# Patient Record
Sex: Female | Born: 1955 | Race: White | Hispanic: No | Smoking: Current every day smoker
Health system: Southern US, Community
[De-identification: ages and names within clinical notes are randomized; demographics above are authoritative.]

## PROBLEM LIST (undated history)

## (undated) DIAGNOSIS — E876 Hypokalemia: Secondary | ICD-10-CM

## (undated) DIAGNOSIS — M1990 Unspecified osteoarthritis, unspecified site: Secondary | ICD-10-CM

## (undated) DIAGNOSIS — M79606 Pain in leg, unspecified: Secondary | ICD-10-CM

## (undated) DIAGNOSIS — F419 Anxiety disorder, unspecified: Secondary | ICD-10-CM

## (undated) DIAGNOSIS — G8929 Other chronic pain: Secondary | ICD-10-CM

## (undated) HISTORY — PX: CARDIAC SURGERY: SHX584

---

## 2012-09-22 ENCOUNTER — Inpatient Hospital Stay (HOSPITAL_COMMUNITY)
Admission: EM | Admit: 2012-09-22 | Discharge: 2012-09-24 | DRG: 690 | Disposition: A | Discharge status: Home Health | Payer: MEDICAID | Attending: Internal Medicine | Admitting: Internal Medicine

## 2012-09-22 ENCOUNTER — Encounter (HOSPITAL_COMMUNITY): Payer: Self-pay

## 2012-09-22 DIAGNOSIS — L03119 Cellulitis of unspecified part of limb: Secondary | ICD-10-CM | POA: Diagnosis present

## 2012-09-22 DIAGNOSIS — L02419 Cutaneous abscess of limb, unspecified: Secondary | ICD-10-CM | POA: Diagnosis present

## 2012-09-22 DIAGNOSIS — L0390 Cellulitis, unspecified: Secondary | ICD-10-CM

## 2012-09-22 DIAGNOSIS — E46 Unspecified protein-calorie malnutrition: Secondary | ICD-10-CM

## 2012-09-22 DIAGNOSIS — N390 Urinary tract infection, site not specified: Principal | ICD-10-CM | POA: Diagnosis present

## 2012-09-22 DIAGNOSIS — F17200 Nicotine dependence, unspecified, uncomplicated: Secondary | ICD-10-CM | POA: Diagnosis present

## 2012-09-22 DIAGNOSIS — D539 Nutritional anemia, unspecified: Secondary | ICD-10-CM | POA: Diagnosis present

## 2012-09-22 DIAGNOSIS — F1010 Alcohol abuse, uncomplicated: Secondary | ICD-10-CM | POA: Diagnosis present

## 2012-09-22 DIAGNOSIS — E860 Dehydration: Secondary | ICD-10-CM

## 2012-09-22 HISTORY — DX: Hypokalemia: E87.6

## 2012-09-22 HISTORY — DX: Other chronic pain: G89.29

## 2012-09-22 HISTORY — DX: Pain in leg, unspecified: M79.606

## 2012-09-22 HISTORY — DX: Anxiety disorder, unspecified: F41.9

## 2012-09-22 LAB — COMPREHENSIVE METABOLIC PANEL
ALT: 18 U/L (ref 0–35)
AST: 46 U/L — ABNORMAL HIGH (ref 0–37)
Albumin: 1.7 g/dL — ABNORMAL LOW (ref 3.5–5.2)
Alkaline Phosphatase: 157 U/L — ABNORMAL HIGH (ref 39–117)
Potassium: 4.1 mEq/L (ref 3.5–5.1)
Sodium: 139 mEq/L (ref 135–145)
Total Protein: 5.6 g/dL — ABNORMAL LOW (ref 6.0–8.3)

## 2012-09-22 LAB — URINALYSIS, ROUTINE W REFLEX MICROSCOPIC
Nitrite: POSITIVE — AB
Specific Gravity, Urine: 1.026 (ref 1.005–1.030)
Urobilinogen, UA: 1 mg/dL (ref 0.0–1.0)

## 2012-09-22 LAB — CBC
HCT: 31.1 % — ABNORMAL LOW (ref 36.0–46.0)
MCHC: 33.8 g/dL (ref 30.0–36.0)
MCV: 106.1 fL — ABNORMAL HIGH (ref 78.0–100.0)
RDW: 14.6 % (ref 11.5–15.5)
WBC: 11.3 10*3/uL — ABNORMAL HIGH (ref 4.0–10.5)

## 2012-09-22 LAB — URINE MICROSCOPIC-ADD ON

## 2012-09-22 LAB — RAPID URINE DRUG SCREEN, HOSP PERFORMED
Amphetamines: NOT DETECTED
Opiates: POSITIVE — AB
Tetrahydrocannabinol: NOT DETECTED

## 2012-09-22 MED ORDER — SODIUM CHLORIDE 0.9 % IV BOLUS (SEPSIS): 1000.0000 mL | Freq: Once | INTRAVENOUS | Status: DC

## 2012-09-22 MED ORDER — PREGABALIN 75 MG PO CAPS
75.0000 mg | ORAL_CAPSULE | Freq: Two times a day (BID) | ORAL | Status: DC
  Administered 2012-09-23 – 2012-09-24 (×4): 75 mg via ORAL
  Filled 2012-09-22 (×3): qty 1

## 2012-09-22 MED ORDER — THIAMINE HCL 100 MG/ML IJ SOLN
100.0000 mg | Freq: Every day | INTRAMUSCULAR | Status: DC
  Filled 2012-09-22 (×2): qty 1

## 2012-09-22 MED ORDER — DIPHENHYDRAMINE HCL 50 MG/ML IJ SOLN: 50.0000 mg | Freq: Once | INTRAMUSCULAR | Status: DC

## 2012-09-22 MED ORDER — ADULT MULTIVITAMIN W/MINERALS CH
1.0000 | ORAL_TABLET | Freq: Every day | ORAL | Status: DC
  Administered 2012-09-23 – 2012-09-24 (×2): 1 via ORAL
  Filled 2012-09-22 (×2): qty 1

## 2012-09-22 MED ORDER — VITAMIN B-1 100 MG PO TABS
100.0000 mg | ORAL_TABLET | Freq: Every day | ORAL | Status: DC
  Administered 2012-09-23 – 2012-09-24 (×2): 100 mg via ORAL
  Filled 2012-09-22 (×2): qty 1

## 2012-09-22 MED ORDER — DEXTROSE 5 % IV SOLN
1.0000 g | Freq: Once | INTRAVENOUS | Status: AC
  Administered 2012-09-22: 1 g via INTRAVENOUS
  Filled 2012-09-22: qty 10

## 2012-09-22 MED ORDER — DEXTROSE 5 % IV SOLN
1.0000 g | INTRAVENOUS | Status: DC
  Administered 2012-09-23: 1 g via INTRAVENOUS
  Filled 2012-09-22 (×2): qty 10

## 2012-09-22 MED ORDER — LORAZEPAM 1 MG PO TABS
1.0000 mg | ORAL_TABLET | Freq: Every day | ORAL | Status: DC | PRN
  Administered 2012-09-23: 1 mg via ORAL

## 2012-09-22 MED ORDER — HEPARIN SODIUM (PORCINE) 5000 UNIT/ML IJ SOLN
5000.0000 [IU] | Freq: Three times a day (TID) | INTRAMUSCULAR | Status: DC
  Administered 2012-09-23 – 2012-09-24 (×5): 5000 [IU] via SUBCUTANEOUS
  Filled 2012-09-22 (×8): qty 1

## 2012-09-22 MED ORDER — SODIUM CHLORIDE 0.9 % IV SOLN
INTRAVENOUS | Status: DC
  Administered 2012-09-23: 03:00:00 via INTRAVENOUS
  Administered 2012-09-23: 100 mL/h via INTRAVENOUS
  Administered 2012-09-24: 10:00:00 via INTRAVENOUS

## 2012-09-22 MED ORDER — HYDROCODONE-ACETAMINOPHEN 5-325 MG PO TABS
1.0000 | ORAL_TABLET | Freq: Four times a day (QID) | ORAL | Status: DC | PRN
  Administered 2012-09-23: 1 via ORAL
  Filled 2012-09-22: qty 1

## 2012-09-22 MED ORDER — THIAMINE HCL 100 MG/ML IJ SOLN
Freq: Once | INTRAVENOUS | Status: AC
  Administered 2012-09-22: 20:00:00 via INTRAVENOUS
  Filled 2012-09-22: qty 1000

## 2012-09-22 MED ORDER — METHYLPREDNISOLONE SODIUM SUCC 125 MG IJ SOLR: 125.0000 mg | Freq: Once | INTRAMUSCULAR | Status: DC

## 2012-09-22 MED ORDER — FOLIC ACID 1 MG PO TABS
1.0000 mg | ORAL_TABLET | Freq: Every day | ORAL | Status: DC
  Administered 2012-09-23 – 2012-09-24 (×2): 1 mg via ORAL
  Filled 2012-09-22 (×2): qty 1

## 2012-09-22 NOTE — ED Provider Notes (Signed)
History     CSN: [id number]  Arrival date & time 09/22/12  1645   First MD Initiated Contact with Patient 09/22/12 1748      Chief Complaint  Patient presents with  . Weakness    x 2 days    (Consider location/radiation/quality/duration/timing/severity/associated sxs/prior treatment) HPI Comments: Maria Gregory 56 y.o. female   The chief complaint is: Patient presents with:   Weakness - x 54 days    56 year old female, who presents to the emergency department with chief complaint of leg pain and weakness. Recent states that she has had numbness and tingling in both legs for some time. She has constant pain in her legs. She states that over the past 2 days. She has sudden onset of weakness in her legs. She is unable to walk. She was seen by her primary care doctor yesterday and treated for hypokalemia. Patient denies history of alcohol abuse. Denies fevers, chills,Denies DOE, SOB, chest tightness or pressure, radiation to left arm, jaw or back, or diaphoresis. Denies dysuria, flank pain, suprapubic pain, frequency, urgency, or hematuria. Denies headaches, light headedness,  visual disturbances. Denies abdominal pain, nausea, vomiting, diarrhea or constipation. Denies hx diabetes. Denies unilateral weakness, difficulty with speech or swallowing.    The history is provided by the patient and a relative.    Past Medical History  Diagnosis Date  . Hypokalemia   . Anxiety   . Chronic leg pain     Past Surgical History  Procedure Date  . Cardiac surgery     History reviewed. No pertinent family history.  History  Substance Use Topics  . Smoking status: Current Every Day Smoker -- 1.0 packs/day    Types: Cigarettes  . Smokeless tobacco: Not on file  . Alcohol Use: No    OB History    Grav Para Term Preterm Abortions TAB SAB Ect Mult Living                  Review of Systems  Constitutional: Negative for fever and chills.  HENT: Negative for trouble swallowing.     Respiratory: Negative for shortness of breath.   Cardiovascular: Negative for chest pain.  Gastrointestinal: Negative for nausea, vomiting, abdominal pain, diarrhea and constipation.  Genitourinary: Negative for dysuria and hematuria.  Musculoskeletal: Negative for myalgias and arthralgias.  Skin: Negative for rash.  Neurological: Positive for weakness and numbness. Negative for dizziness, tremors, seizures, syncope, facial asymmetry, speech difficulty, light-headedness and headaches.  Psychiatric/Behavioral: Negative for confusion.  All other systems reviewed and are negative.    Allergies  Review of patient's allergies indicates no known allergies.  Home Medications   Current Outpatient Rx  Name Route Sig Dispense Refill  . HYDROCODONE-ACETAMINOPHEN 5-500 MG PO TABS Oral Take 1 tablet by mouth every 6 (six) hours as needed. pain    . LORAZEPAM 1 MG PO TABS Oral Take 0.5-1 mg by mouth daily as needed. anxiety    . PREGABALIN 75 MG PO CAPS Oral Take 75 mg by mouth 2 (two) times daily.    Marland Kitchen PRESCRIPTION MEDICATION  Pt states she is on a liquid for lipids. Pt's pharmacy is closed to confirm name of med and dosage.      BP 102/74  Pulse 111  Temp 97.6 F (36.4 C) (Oral)  Resp 18  SpO2 100%  Physical Exam  Nursing note and vitals reviewed. Constitutional: She is oriented to person, place, and time.       Patient is thin and unkempt. She  has multiple abrasions.  HENT:  Head: Normocephalic and atraumatic.       Telangectasia of the cheeks.  Eyes: Conjunctivae normal are normal. No scleral icterus.  Neck: No JVD present.  Cardiovascular: Normal rate, regular rhythm and normal heart sounds.        bl  2+ pitting edema  Pulmonary/Chest: No respiratory distress. She exhibits no tenderness.  Neurological: She is alert and oriented to person, place, and time.       Marked weakness of the legs bl    ED Course  Procedures (including critical care time)  Labs Reviewed   URINALYSIS, ROUTINE W REFLEX MICROSCOPIC - Abnormal; Notable for the following:    Color, Urine ORANGE (*)  BIOCHEMICALS MAY BE AFFECTED BY COLOR   APPearance CLOUDY (*)     Hgb urine dipstick SMALL (*)     Bilirubin Urine SMALL (*)     Ketones, ur TRACE (*)     Protein, ur 30 (*)     Nitrite POSITIVE (*)     Leukocytes, UA LARGE (*)     All other components within normal limits  COMPREHENSIVE METABOLIC PANEL - Abnormal; Notable for the following:    Total Protein 5.6 (*)     Albumin 1.7 (*)     AST 46 (*)     Alkaline Phosphatase 157 (*)     All other components within normal limits  CBC - Abnormal; Notable for the following:    WBC 11.3 (*)     RBC 2.93 (*)     Hemoglobin 10.5 (*)     HCT 31.1 (*)     MCV 106.1 (*)     MCH 35.8 (*)     All other components within normal limits  URINE RAPID DRUG SCREEN (HOSP PERFORMED) - Abnormal; Notable for the following:    Opiates POSITIVE (*)     Benzodiazepines POSITIVE (*)     All other components within normal limits  URINE MICROSCOPIC-ADD ON - Abnormal; Notable for the following:    Squamous Epithelial / LPF FEW (*)     Bacteria, UA MANY (*)     All other components within normal limits  GLUCOSE, CAPILLARY  ETHANOL  ACETAMINOPHEN LEVEL  URINE CULTURE   No results found.   No diagnosis found.    MDM  Patient with labs sig for ast/alt > 2, megaloblastic anemia. UA positive, given rocephin.   Patient labs and pe consistent with chronic etoh abuse.  Relatives arrive and state that patient has been drinking 1-2 half gallons of vodka every 3 days for years. They state that she has also recently begun drinking nyquil in an effort toget off of etoh. Patient admits to etoh abuse but not to nyquil  Obtaining acetaminophen levels.   8:42 PM BP 102/81  Pulse 100  Temp 98.3 F (36.8 C) (Oral)  Resp 18  SpO2 100%  patient seen in shared visit with Dr. Ranae Palms who agrees to assume care of the patient.         Arthor Captain, PA-C 09/22/12 2043

## 2012-09-22 NOTE — ED Notes (Signed)
Pt aware of the need for a urine sample however is unable to void at this time. 

## 2012-09-22 NOTE — ED Notes (Signed)
Spoke with pharmacy to verify compatibility of vitamin bag currently hanging and Rocephin. Advised these medications are compatible.

## 2012-09-22 NOTE — ED Notes (Signed)
WUJ:WJ19<JY> Expected date:09/22/12<BR> Expected time: 4:34 PM<BR> Means of arrival:Ambulance<BR> Comments:<BR> Weakness

## 2012-09-22 NOTE — H&P (Signed)
[facility] History and Physical  Maria Gregory YQM:[id number] DOB: [birth year] DOA: 09/22/2012  Referring physician: ED PCP: Pcp Not In System  Specialists: None  Chief Complaint: Weakness  HPI: Maria Gregory is a 56 y.o. female who presents with c/o leg pain and B leg weakness.  Numbness and tingling in both legs for some time.  Onset of weakness gradual over the past 2 days, difficulty with walking.  Seen by PCP yesterday treated for hypokalemia.  Patient denies h/o EtOH abuse but admits she has been using "nyquil to try and quit EtOH", denies f/c, n/v/d, flank pain, dysuria.  In the ED patient was found to have UTI, lab findings c/w chronic EtOH abuse, apparently relatives told a different story than patient to ED doc (they said 0.5-1 gallon of vodka every 3 days for years.  Patient finally admitted to ED doc she was abusing EtOH but not the nyquil.  Acetaminophen level negative, hospitalist asked to admit for UTI, dehydration, with weakness.  Review of Systems: 12 systems reviewed and otherwise negative  Past Medical History  Diagnosis Date  . Hypokalemia   . Anxiety   . Chronic leg pain    Past Surgical History  Procedure Date  . Cardiac surgery    Social History:  reports that she has been smoking Cigarettes.  She has been smoking about 1 pack per day. She does not have any smokeless tobacco history on file. She reports that she does not drink alcohol or use illicit drugs. Patient lives at home.  No Known Allergies  History reviewed. No pertinent family history. No FH of EtOH abuse.  Prior to Admission medications   Medication Sig Start Date End Date Taking? Authorizing Provider  HYDROcodone-acetaminophen (VICODIN) 5-500 MG per tablet Take 1 tablet by mouth every 6 (six) hours as needed. pain   Yes Historical Provider, MD  LORazepam (ATIVAN) 1 MG tablet Take 0.5-1 mg by mouth daily as needed. anxiety   Yes Historical Provider, MD  pregabalin (LYRICA) 75 MG capsule Take 75  mg by mouth 2 (two) times daily.   Yes Historical Provider, MD  PRESCRIPTION MEDICATION Pt states she is on a liquid for lipids. Pt's pharmacy is closed to confirm name of med and dosage.   Yes Historical Provider, MD   Physical Exam: Filed Vitals:   09/22/12 1645 09/22/12 1646 09/22/12 1950  BP:  102/74 102/81  Pulse:  111 100  Temp:  97.6 F (36.4 C) 98.3 F (36.8 C)  TempSrc:  Oral Oral  Resp:  18   SpO2: 92% 100% 100%    General:  NAD, resting comfortably in bed Eyes: PEERLA EOMI ENT: mucous membranes moist Neck: supple w/o JVD Cardiovascular: RRR w/o MRG Respiratory: CTA B Abdomen: soft, nt, nd, bs+ Skin: no rash nor lesion Musculoskeletal: MAE, full ROM all 4 extremities Psychiatric: normal tone and affect Neurologic: AAOx3, grossly non-focal  Labs on Admission:  Basic Metabolic Panel:  Lab 09/22/12 5284  NA 139  K 4.1  CL 108  CO2 23  GLUCOSE 89  BUN 7  CREATININE 0.58  CALCIUM 8.4  MG --  PHOS --   Liver Function Tests:  Lab 09/22/12 1701  AST 46*  ALT 18  ALKPHOS 157*  BILITOT 1.1  PROT 5.6*  ALBUMIN 1.7*   No results found for this basename: LIPASE:5,AMYLASE:5 in the last 168 hours No results found for this basename: AMMONIA:5 in the last 168 hours CBC:  Lab 09/22/12 1701  WBC 11.3*  NEUTROABS --  HGB 10.5*  HCT 31.1*  MCV 106.1*  PLT 374   Cardiac Enzymes: No results found for this basename: CKTOTAL:5,CKMB:5,CKMBINDEX:5,TROPONINI:5 in the last 168 hours  BNP (last 3 results) No results found for this basename: PROBNP:3 in the last 8760 hours CBG:  Lab 09/22/12 1702  GLUCAP 90    Radiological Exams on Admission: No results found.  EKG: Independently reviewed.  Assessment/Plan Principal Problem:  *UTI (lower urinary tract infection) Active Problems:  ETOH abuse  Macrocytic anemia   1. UTI - putting patient on rocephin while inpatient to treat, no evidence of sepsis nor pyleonephritis. 2. EtOH abuse - chronic, patient  insists she never has anything like withdrawals but will monitor while here for these, unfortunately it does not sound like the patient has much insight into her disease. 3. Macrocytic anemia - secondary to vitamin deficiency due to #2, use banana bag in ED, ordering thiamine, folate, and MV while here. 4. Chronic pain - will leave on home meds.  Code Status: Full Code Family Communication: No family present during time of admission Disposition Plan: Admit to obs, likely DC tomorrow.  Time spent: 50 min  Jalik Gellatly M. [facility] Pager [phone]  If 7PM-7AM, please contact night-coverage [email or web address] Password [facility] 09/22/2012, 11:37 PM

## 2012-09-22 NOTE — ED Notes (Signed)
Pt still unable to provide us with a urine sample. 

## 2012-09-22 NOTE — ED Notes (Signed)
Pt reports she visited her PCP yesterday and she was told that everything was fine and to come back in 3 months.  Reports "it wasn't this bad yesterday," in reference to numbness in BLE and edema in LLE.  Upon inspection Pt's Ativan precription had been mixed into a different bottle by a "friend."  Pt also has a wound on R forearm.  Reports she cut it but cant not remember when or what she cut it on.  Pt denies memory problems.

## 2012-09-22 NOTE — ED Notes (Signed)
Per [facility]- Pt presents with NAD- NEGATIVE FOR STROKE- GCS15 PERRL .  Pt seen and treated at [facility] hospital  yesterday DX hypokalemia and given oral med.  Pt reports feeling no different.

## 2012-09-22 NOTE — ED Notes (Signed)
Pt is now reporting that the "cut" on forearm happened yesterday after her PCP appointment.  Also, reports "I may have taken more sleeping pills than I was supposed to, but I needed to sleep."  Denies taking any Ativan today.

## 2012-09-22 NOTE — ED Provider Notes (Signed)
Medical screening examination/treatment/procedure(s) were conducted as a shared visit with non-physician practitioner(s) and myself.  I personally evaluated the patient during the encounter   Kimyetta Flott, MD 09/22/12 2338 

## 2012-09-22 NOTE — ED Notes (Signed)
Patient given water. "I don't want this, I don't like water, I can't drink this."

## 2012-09-23 DIAGNOSIS — D539 Nutritional anemia, unspecified: Secondary | ICD-10-CM

## 2012-09-23 DIAGNOSIS — E860 Dehydration: Secondary | ICD-10-CM

## 2012-09-23 DIAGNOSIS — L0390 Cellulitis, unspecified: Secondary | ICD-10-CM | POA: Diagnosis present

## 2012-09-23 DIAGNOSIS — N390 Urinary tract infection, site not specified: Principal | ICD-10-CM

## 2012-09-23 DIAGNOSIS — F1010 Alcohol abuse, uncomplicated: Secondary | ICD-10-CM

## 2012-09-23 LAB — CBC
HCT: 25.2 % — ABNORMAL LOW (ref 36.0–46.0)
Hemoglobin: 8.2 g/dL — ABNORMAL LOW (ref 12.0–15.0)
MCV: 106.8 fL — ABNORMAL HIGH (ref 78.0–100.0)
Platelets: 330 10*3/uL (ref 150–400)
RBC: 2.36 MIL/uL — ABNORMAL LOW (ref 3.87–5.11)
WBC: 9 10*3/uL (ref 4.0–10.5)

## 2012-09-23 LAB — BASIC METABOLIC PANEL
BUN: 7 mg/dL (ref 6–23)
CO2: 20 mEq/L (ref 19–32)
Chloride: 111 mEq/L (ref 96–112)
Creatinine, Ser: 0.63 mg/dL (ref 0.50–1.10)

## 2012-09-23 LAB — MAGNESIUM: Magnesium: 1.9 mg/dL (ref 1.5–2.5)

## 2012-09-23 MED ORDER — DOXYCYCLINE HYCLATE 100 MG IV SOLR
100.0000 mg | Freq: Two times a day (BID) | INTRAVENOUS | Status: DC
  Administered 2012-09-23 – 2012-09-24 (×3): 100 mg via INTRAVENOUS
  Filled 2012-09-23 (×5): qty 100

## 2012-09-23 MED ORDER — NICOTINE 21 MG/24HR TD PT24
21.0000 mg | MEDICATED_PATCH | Freq: Every day | TRANSDERMAL | Status: DC
  Administered 2012-09-23 – 2012-09-24 (×2): 21 mg via TRANSDERMAL
  Filled 2012-09-23 (×2): qty 1

## 2012-09-23 MED ORDER — LORAZEPAM 1 MG PO TABS
1.0000 mg | ORAL_TABLET | Freq: Three times a day (TID) | ORAL | Status: DC | PRN
  Administered 2012-09-23 (×2): 1 mg via ORAL
  Filled 2012-09-23 (×2): qty 1

## 2012-09-23 MED ORDER — BIOTENE DRY MOUTH MT LIQD
15.0000 mL | Freq: Two times a day (BID) | OROMUCOSAL | Status: DC
  Administered 2012-09-23 – 2012-09-24 (×2): 15 mL via OROMUCOSAL

## 2012-09-23 NOTE — Evaluation (Signed)
Physical Therapy Evaluation Patient Details Name: Maria Gregory MRN: [id number] DOB: 1956-08-21 Today's Date: 09/23/2012 Time: 1000-1031 PT Time Calculation (min): 31 min  PT Assessment / Plan / Recommendation Clinical Impression  56 yo female admitted with leg weakness and inability to walk. Pt found to have UTI and has hx of chronic ETOH abuse and has been recently treated for hypokalemia.   Pt with c/o pain in feet, legs and waist. She appears to have generalized muscle atophy, skin impairment with multiple areas ecchymosis and edema in LE's L>R.  She also has hypotension. She has difficulty with bed mobility and is unable to walk with RW today.  Expect she would benefit from continued PT at d/c and will need 24/7 assist     PT Assessment  Patient needs continued PT services    Follow Up Recommendations  Post acute inpatient;Supervision/Assistance - 24 hour    Does the patient have the potential to tolerate intense rehabilitation   No, Recommend SNF  Barriers to Discharge Decreased caregiver support;Inaccessible home environment pt alone while uncle works, 7 steps to enter Product manager with 5" wheels;3 in 1 bedside comode    Recommendations for Other Services OT consult   Frequency Min 3X/week    Precautions / Restrictions Precautions Precautions: Fall Precaution Comments: scabbed areas on knees and multiple eccymotic areas over body Restrictions Weight Bearing Restrictions: No   Pertinent Vitals/Pain Pt reports pain in feet is 8/10.  She also reports pain in legs and waist that she did not rate      Mobility  Bed Mobility Bed Mobility: Rolling Right;Rolling Left;Supine to Sit;Sit to Supine Rolling Right: 3: Mod assist;With rail Rolling Left: 3: Mod assist;With rail Supine to Sit: HOB elevated;With rails;2: Max assist Sit to Supine: 2: Max assist Details for Bed Mobility Assistance: pt is unable to roll to either direction even when  trying to pull on rails with UEs  She needs multimodal cues and assist to turn pelvis. Pt needs max assist to raise upper body into sittiing and to lift legs back up onto bed Transfers Transfers: Sit to Stand;Stand to Sit Sit to Stand: 3: Mod assist Stand to Sit: 3: Mod assist Details for Transfer Assistance: Pt needs assist to lift hips off bed. Once in standing she is able to maintain position with min assist Ambulation/Gait Ambulation/Gait Assistance: 3: Mod assist Ambulation Distance (Feet): 2 Feet Ambulation/Gait Assistance Details: Pt needs assist to stabalize pelvis to allow weight shift to step toward head of bed.  Gait Pattern: Decreased step length - right;Decreased step length - left;Decreased weight shift to left;Decreased weight shift to right;Trunk flexed Gait velocity: decreased General Gait Details: Pt is unable to walk today due to leg weakness and possibly decreased BP Stairs: No Wheelchair Mobility Wheelchair Mobility: No    Shoulder Instructions     Exercises General Exercises - Lower Extremity Ankle Circles/Pumps: AROM;Both;5 reps;Supine Short Arc Quad: AROM;Both;10 reps;Supine Hip ABduction/ADduction: AAROM;Both;5 reps;Supine Straight Leg Raises: AAROM;Both;Supine Other Exercises Other Exercises: lower trunk stability in hook lying  Other Exercises: assisted rolling   PT Diagnosis: Difficulty walking;Abnormality of gait;Generalized weakness;Acute pain  PT Problem List: Decreased strength;Pain;Decreased mobility;Decreased knowledge of use of DME;Decreased safety awareness;Decreased activity tolerance;Decreased balance PT Treatment Interventions: DME instruction;Gait training;Functional mobility training;Stair training;Therapeutic activities;Therapeutic exercise;Patient/family education   PT Goals Acute Rehab PT Goals PT Goal Formulation: With patient Time For Goal Achievement: 10/07/12 Potential to Achieve Goals: Fair Pt will Roll Supine to Right Side:  Independently PT Goal: Rolling Supine to Right Side - Progress: Goal set today Pt will Roll Supine to Left Side: Independently PT Goal: Rolling Supine to Left Side - Progress: Goal set today Pt will go Supine/Side to Sit: Independently PT Goal: Supine/Side to Sit - Progress: Goal set today Pt will go Sit to Supine/Side: Independently PT Goal: Sit to Supine/Side - Progress: Goal set today Pt will go Sit to Stand: with modified independence PT Goal: Sit to Stand - Progress: Goal set today Pt will go Stand to Sit: with modified independence PT Goal: Stand to Sit - Progress: Goal set today Pt will Ambulate: >150 feet;with modified independence;with least restrictive assistive device PT Goal: Ambulate - Progress: Goal set today  Visit Information  Last PT Received On: 09/23/12 Assistance Needed: +1    Subjective Data  Subjective: Multiple c/o. especially with pain in legs and waist Patient Stated Goal: to go home where she can rest   Prior Functioning  Home Living Lives With: Family;Other (Comment) (uncle who works during the day) Available Help at Discharge: Family Type of Home: House Home Access: Stairs to enter Secretary/administrator of Steps: 7 Entrance Stairs-Rails: Right;Left;Can reach both Home Layout: One level Home Adaptive Equipment: Straight cane;Crutches Prior Function Level of Independence: Independent Able to Take Stairs?: Yes Communication Communication: No difficulties    Cognition  Overall Cognitive Status: Appears within functional limits for tasks assessed/performed Arousal/Alertness: Awake/alert (pt falls asleep easily) Orientation Level:  (pt unable to recall what brought her here) Behavior During Session: [facility] for tasks performed    Extremity/Trunk Assessment Right Lower Extremity Assessment RLE ROM/Strength/Tone: Deficits RLE ROM/Strength/Tone Deficits: pitting edema in leg (L>R) with poor skin integrity. strength is grossly 3-/5 with some intention  tremor evident Pt with muscle atrophy throughout RLE Sensation: Deficits RLE Sensation Deficits: decreased touch sensation especially in medial foot and great toe RLE Coordination: Deficits RLE Coordination Deficits: limited by decreased strength  Left Lower Extremity Assessment LLE ROM/Strength/Tone: Deficits LLE ROM/Strength/Tone Deficits: pitting edema in leg (L>R) with poor skin integrity. strength is grossly 2+/5 with some intention tremor evident Pt with muscle atrophy throughout LLE Sensation: Deficits LLE Sensation Deficits: decreased touch sensation especially in medial foot and great toe LLE Coordination: Deficits LLE Coordination Deficits: decreased touch sensation especially in medial foot and great toe Trunk Assessment Trunk Assessment: Other exceptions Trunk Exceptions: muscle atrophy, impaired skin integrity with dryness   Balance Balance Balance Assessed: Yes Static Sitting Balance Static Sitting - Balance Support: Bilateral upper extremity supported Static Sitting - Level of Assistance: 4: Min assist Static Sitting - Comment/# of Minutes: 1 Static Standing Balance Static Standing - Balance Support: Bilateral upper extremity supported;During functional activity Static Standing - Level of Assistance: 3: Mod assist Static Standing - Comment/# of Minutes: 1  End of Session PT - End of Session Activity Tolerance: Patient limited by fatigue Patient left: in bed;with call bell/phone within reach Nurse Communication: Mobility status  GP Functional Assessment Tool Used: clincial judgement Functional Limitation: Mobility: Walking and moving around Mobility: Walking and Moving Around Current Status (Z6109): At least 80 percent but less than 100 percent impaired, limited or restricted Mobility: Walking and Moving Around Goal Status [id number]): At least 1 percent but less than 20 percent impaired, limited or restricted   Donnetta Hail 09/23/2012, 11:09 AM

## 2012-09-23 NOTE — Progress Notes (Signed)
[facility] PROGRESS NOTE  Maria Gregory ZOX:[id number] DOB: [birth year] DOA: 09/22/2012 PCP: Pcp Not In System  Brief narrative: 56 year old female admitted for possible acute alcohol intoxication and UTI. Patient does have lower extremity swelling concerning for cellulitis.  Assessment/Plan:  Principal Problem:  *UTI (lower urinary tract infection) - continue rocephin - follow up urine culture results  Active Problems:  Cellulitis - started doxycycline   ETOH abuse - continue multivitamin, thiamine - alcohol level WNL on admission   Macrocytic anemia - perhaps due to alcohol abuse - will follow up CBC in am  Code Status: full code Family Communication: no family at bedside Disposition Plan: needs PT evaluation  Manson Passey, MD  [facility] Pager [phone]  If 7PM-7AM, please contact night-coverage [email or web address] Password [email or web address] 09/23/2012, 4:26 PM   LOS: 1 day   Consultants:  None   Procedures:  None   Antibiotics:  Rocephin -->  Doxycycline -->  HPI/Subjective: No acute events overnight.  Objective: Filed Vitals:   09/23/12 0044 09/23/12 0554 09/23/12 1016 09/23/12 1345  BP: 109/71 88/61 90/58  100/72  Pulse: 106 68 92 91  Temp: 97.8 F (36.6 C) 97.4 F (36.3 C)  98.4 F (36.9 C)  TempSrc: Oral Oral  Oral  Resp: 16 18  17   Height: 5\' 6"  (1.676 m)     Weight: 53.524 kg (118 lb)     SpO2: 99% 98% 95% 100%    Intake/Output Summary (Last 24 hours) at 09/23/12 1626 Last data filed at 09/23/12 1345  Gross per 24 hour  Intake   2140 ml  Output    350 ml  Net   1790 ml    Exam:   General:  Pt is alert, follows commands appropriately, not in acute distress  Cardiovascular: Regular rate and rhythm, S1/S2, no murmurs, no rubs, no gallops  Respiratory: Clear to auscultation bilaterally, no wheezing, no crackles, no rhonchi  Abdomen: Soft, non tender, non distended, bowel sounds present, no guarding  Extremities: No edema, pulses DP and PT  palpable bilaterally  Neuro: Grossly nonfocal  Data Reviewed: Basic Metabolic Panel:  Lab 09/23/12 8295 09/22/12 1701  NA 140 139  K 3.5 4.1  CL 111 108  CO2 20 23  GLUCOSE 95 89  BUN 7 7  CREATININE 0.63 0.58  CALCIUM 7.4* 8.4   Liver Function Tests:  Lab 09/22/12 1701  AST 46*  ALT 18  ALKPHOS 157*  BILITOT 1.1  PROT 5.6*  ALBUMIN 1.7*   CBC:  Lab 09/23/12 0342 09/22/12 1701  WBC 9.0 11.3*  HGB 8.2* 10.5*  HCT 25.2* 31.1*  MCV 106.8* 106.1*  PLT 330 374   CBG:  Lab 09/22/12 1702  GLUCAP 90    Studies: No results found.  Scheduled Meds:   . cefTRIAXone   1 g Intravenous Q24H  . doxycycline   100 mg Intravenous Q12H  . folic acid  1 mg Oral Daily  . heparin  5,000 Units Subcutaneous Q8H  . multivitamin   1 tablet Oral Daily  . pregabalin  75 mg Oral BID  . thiamine  100 mg Oral Daily   Continuous Infusions:   . sodium chloride 100 mL/hr (09/23/12 0926)

## 2012-09-23 NOTE — ED Notes (Addendum)
Report called to Bonita Quin, RN on 3E. All questions answered.

## 2012-09-23 NOTE — Progress Notes (Signed)
Initial review for observation is complete.

## 2012-09-24 DIAGNOSIS — L0291 Cutaneous abscess, unspecified: Secondary | ICD-10-CM

## 2012-09-24 LAB — URINE CULTURE: Colony Count: >=100000

## 2012-09-24 LAB — CBC
Hemoglobin: 9.6 g/dL — ABNORMAL LOW (ref 12.0–15.0)
MCH: 35.4 pg — ABNORMAL HIGH (ref 26.0–34.0)
RBC: 2.71 MIL/uL — ABNORMAL LOW (ref 3.87–5.11)

## 2012-09-24 MED ORDER — LORAZEPAM 1 MG PO TABS: 0.5000 mg | ORAL_TABLET | Freq: Every day | ORAL | Status: AC | PRN

## 2012-09-24 MED ORDER — HYDROCODONE-ACETAMINOPHEN 5-500 MG PO TABS: 1.0000 | ORAL_TABLET | Freq: Four times a day (QID) | ORAL | Status: AC | PRN

## 2012-09-24 MED ORDER — DOXYCYCLINE HYCLATE 100 MG PO TABS: 100.0000 mg | ORAL_TABLET | Freq: Two times a day (BID) | ORAL | Status: DC

## 2012-09-24 NOTE — Care Management Note (Signed)
    Page 1 of 2   09/24/2012     3:31:11 PM   CARE MANAGEMENT NOTE 09/24/2012  Patient:  Maria Gregory, Maria Gregory   Account Number:  [id number]  Date Initiated:  09/24/2012  Documentation initiated by:  Konrad Felix  Subjective/Objective Assessment:   Patient admitted with abdominal pain, nausea, vomiting.     Action/Plan:   Discharge to home with home health services.   Anticipated DC Date:  09/24/2012   Anticipated DC Plan:  HOME W HOME HEALTH SERVICES      DC Planning Services  CM consult      PAC Choice  DURABLE MEDICAL EQUIPMENT  HOME HEALTH   Choice offered to / List presented to:  C-1 Patient   DME arranged  3-N-1  Levan Hurst      DME agency  [facility]     HH arranged  HH-2 PT      [facility] agency  [facility]   Status of service:  Completed, signed off Medicare Important Message given?   (If response is "NO", the following Medicare IM given date fields will be blank) Date Medicare IM given:   Date Additional Medicare IM given:    Discharge Disposition:  HOME W HOME HEALTH SERVICES  Per UR Regulation:  Reviewed for med. necessity/level of care/duration of stay  If discussed at Long Length of Stay Meetings, dates discussed:    Comments:  09/24/2012  3:25pm  Konrad Felix RN, case manager   [phone] Per orders, I contacted Norberta Keens of [facility] to set up home PT and Diego Cory of [facility] to deliver the 3-in-one and the rolling walker. I notified the attending nurse Shanda Bumps) that it may be about an hour before delivery can be made to the hospital room,

## 2012-09-24 NOTE — Discharge Summary (Signed)
Physician Discharge Summary  Francoise Eggett ZOX:[id number] DOB: 09/22/1956 DOA: 09/22/2012  PCP: Pcp Not In System  Admit date: 09/22/2012 Discharge date: 09/24/2012  Recommendations for Outpatient Follow-up:  1. Follow up with PCP in 1-2 weeks post discharge or sooner if symptoms worsen  Discharge Diagnoses:  Principal Problem:  *UTI (lower urinary tract infection) Active Problems:  Cellulitis  ETOH abuse  Macrocytic anemia  Discharge Condition: medically stable for discharge home today with HHPT and rolling walker  Diet recommendation: as tolerated  History of present illness:  56 year old female admitted for possible acute alcohol intoxication and UTI. Patient does have lower extremity swelling concerning for cellulitis.   Assessment/Plan:   Principal Problem:  *UTI (lower urinary tract infection)  - rocephin given in hospital but at the time of discharge we will give doxycycline which should cover for cellulitis and UTI  Active Problems:  Cellulitis  - started doxycycline and we will continue this medication on discharge which will cover for UTI as well ETOH abuse  - continue multivitamin, thiamine  - alcohol level WNL on admission  Macrocytic anemia  - perhaps due to alcohol abuse  - no signs of active bleed - hemoglobin stable   Code Status: full code  Family Communication: no family at bedside  Disposition Plan: home today with HHPT  Manson Passey, MD  [facility]  Pager [phone]   Consultants:  None  Procedures:  None  Antibiotics:  Rocephin --> 09/24/2012 Doxycycline --> we will continue this medication for 10 more days on discharge  Discharge Exam: Filed Vitals:   09/24/12 0555  BP: 110/68  Pulse: 98  Temp: 98.4 F (36.9 C)  Resp: 18   Filed Vitals:   09/23/12 1016 09/23/12 1345 09/23/12 2113 09/24/12 0555  BP: 90/58 100/72 105/60 110/68  Pulse: 92 91 111 98  Temp:  98.4 F (36.9 C) 98.3 F (36.8 C) 98.4 F (36.9 C)  TempSrc:  Oral Oral Oral    Resp:  17 16 18   Height:      Weight:      SpO2: 95% 100% 100% 98%    General: Pt is alert, follows commands appropriately, not in acute distress Cardiovascular: Regular rate and rhythm, S1/S2 +, no murmurs, no rubs, no gallops Respiratory: Clear to auscultation bilaterally, no wheezing, no crackles, no rhonchi Abdominal: Soft, non tender, non distended, bowel sounds +, no guarding Extremities: no edema, no cyanosis, pulses palpable bilaterally DP and PT Neuro: Grossly nonfocal  Discharge Instructions  Discharge Orders    Future Orders Please Complete By Expires   Diet - low sodium heart healthy      Increase activity slowly          Medication List     As of 09/24/2012  2:17 PM    TAKE these medications         doxycycline 100 MG tablet   Commonly known as: VIBRA-TABS   Take 1 tablet (100 mg total) by mouth 2 (two) times daily.      HYDROcodone-acetaminophen 5-500 MG per tablet   Commonly known as: VICODIN   Take 1 tablet by mouth every 6 (six) hours as needed. pain      LORazepam 1 MG tablet   Commonly known as: ATIVAN   Take 0.5-1 tablets (0.5-1 mg total) by mouth daily as needed. anxiety      pregabalin 75 MG capsule   Commonly known as: LYRICA   Take 75 mg by mouth 2 (two) times daily.  PRESCRIPTION MEDICATION   Pt states she is on a liquid for lipids. Pt's pharmacy is closed to confirm name of med and dosage.           Follow-up Information    Follow up with Pcp Not In System.          The results of significant diagnostics from this hospitalization (including imaging, microbiology, ancillary and laboratory) are listed below for reference.    Significant Diagnostic Studies: No results found.  Microbiology: Recent Results (from the past 240 hour(s))  URINE CULTURE     Status: Normal   Collection Time   09/22/12  6:52 PM      Component Value Range Status Comment   Specimen Description URINE, CLEAN CATCH   Final    Special Requests NONE    Final    Culture  Setup Time 09/23/2012 12:46   Final    Colony Count >=100,000 COLONIES/ML   Final    Culture     Final    Value: Multiple bacterial morphotypes present, none predominant. Suggest appropriate recollection if clinically indicated.   Report Status 09/24/2012 FINAL   Final      Labs: Basic Metabolic Panel:  Lab 09/23/12 4098 09/22/12 1901 09/22/12 1701  NA 140 -- 139  K 3.5 -- 4.1  CL 111 -- 108  CO2 20 -- 23  GLUCOSE 95 -- 89  BUN 7 -- 7  CREATININE 0.63 -- 0.58  CALCIUM 7.4* -- 8.4  MG -- 1.9 --  PHOS -- 4.0 --   Liver Function Tests:  Lab 09/22/12 1701  AST 46*  ALT 18  ALKPHOS 157*  BILITOT 1.1  PROT 5.6*  ALBUMIN 1.7*   No results found for this basename: LIPASE:5,AMYLASE:5 in the last 168 hours No results found for this basename: AMMONIA:5 in the last 168 hours CBC:  Lab 09/24/12 0815 09/23/12 0342 09/22/12 1701  WBC 8.1 9.0 11.3*  NEUTROABS -- -- --  HGB 9.6* 8.2* 10.5*  HCT 28.9* 25.2* 31.1*  MCV 106.6* 106.8* 106.1*  PLT 348 330 374   Cardiac Enzymes: No results found for this basename: CKTOTAL:5,CKMB:5,CKMBINDEX:5,TROPONINI:5 in the last 168 hours BNP: BNP (last 3 results) No results found for this basename: PROBNP:3 in the last 8760 hours CBG:  Lab 09/22/12 1702  GLUCAP 90    Time coordinating discharge: Over 30 minutes  Signed:  Manson Passey, MD  [facility] 09/24/2012, 2:17 PM  Pager #: [phone]

## 2012-09-24 NOTE — Progress Notes (Addendum)
Pt d/c instructions reviewed with not only the pt herself but also her cousin with whom she lives with.  Prescriptions reviewed and all questions and concerns addressed. Pt alert and oriented x3, VSS, skin tears to bilat. upper and lower extremities present on admission. Family is not very happy that the pt has been d/c home, they feel that she needs inpatient rehab for ETOH abuse but at this present time pt denies ETOH abuse and refuses any type of treatment. HHPT has been set up and pt sent home with RW and 3 in 1.

## 2012-09-24 NOTE — Progress Notes (Signed)
Physical Therapy Treatment Patient Details Name: Maria Gregory MRN: [id number] DOB: 09-Nov-1956 Today's Date: 09/24/2012 Time: 4098-1191 PT Time Calculation (min): 18 min  PT Assessment / Plan / Recommendation Comments on Treatment Session  Significant progress with mobility today compared to yesterday. Pt ambulated 220' with RW and supervision. Pt states her uncle will be home with her as well as friends checking in on her. Recommend HHPT and RW.     Follow Up Recommendations  Supervision - Intermittent;Home health PT     Does the patient have the potential to tolerate intense rehabilitation     Barriers to Discharge        Equipment Recommendations  Rolling walker with 5" wheels;3 in 1 bedside comode    Recommendations for Other Services OT consult  Frequency Min 3X/week   Plan Discharge plan needs to be updated    Precautions / Restrictions Precautions Precautions: Fall Precaution Comments: scabbed areas on knees and multiple eccymotic areas over body, pt denies falls and doesn't know where wounds came from Restrictions Weight Bearing Restrictions: No   Pertinent Vitals/Pain *0/10**    Mobility  Bed Mobility Bed Mobility: Supine to Sit Supine to Sit: With rails;5: Supervision;6: Modified independent (Device/Increase time);HOB elevated Transfers Transfers: Sit to Stand;Stand to Sit Sit to Stand: 5: Supervision;From bed Stand to Sit: 5: Supervision;To chair/3-in-1 Details for Transfer Assistance: supervision for balance Ambulation/Gait Ambulation/Gait Assistance: 5: Supervision Ambulation Distance (Feet): 220 Feet Assistive device: Rolling walker Gait Pattern: Within Functional Limits Gait velocity: decreased General Gait Details: verbal cues to negotiate obstacles, pt didn't seem aware of approaching wall  Stairs: No Wheelchair Mobility Wheelchair Mobility: No    Exercises     PT Diagnosis:    PT Problem List:   PT Treatment Interventions:     PT  Goals Acute Rehab PT Goals PT Goal Formulation: With patient Time For Goal Achievement: 10/07/12 Potential to Achieve Goals: Fair Pt will Roll Supine to Right Side: Independently Pt will Roll Supine to Left Side: Independently Pt will go Supine/Side to Sit: Independently PT Goal: Supine/Side to Sit - Progress: Progressing toward goal Pt will go Sit to Supine/Side: Independently Pt will go Sit to Stand: with modified independence PT Goal: Sit to Stand - Progress: Progressing toward goal Pt will go Stand to Sit: with modified independence PT Goal: Stand to Sit - Progress: Progressing toward goal Pt will Ambulate: >150 feet;with modified independence;with least restrictive assistive device PT Goal: Ambulate - Progress: Progressing toward goal  Visit Information  Last PT Received On: 09/24/12 Assistance Needed: +1    Subjective Data  Subjective: "I like this walker." Patient Stated Goal: to go home   Cognition  Overall Cognitive Status: Appears within functional limits for tasks assessed/performed Arousal/Alertness: Awake/alert (pt falls asleep easily) Orientation Level: Appears intact for tasks assessed (pt unable to recall what brought her here) Behavior During Session: [facility] for tasks performed    Balance  Balance Balance Assessed: Yes Static Sitting Balance Static Sitting - Balance Support: Bilateral upper extremity supported Static Sitting - Level of Assistance: 6: Modified independent (Device/Increase time) Static Sitting - Comment/# of Minutes: 2  End of Session PT - End of Session Activity Tolerance: Patient tolerated treatment well Patient left: with call bell/phone within reach;in chair Nurse Communication: Mobility status   GP     Ralene Bathe Kistler 09/24/2012, 1:49 PM [phone]

## 2012-09-24 NOTE — Progress Notes (Signed)
I have now made approximately 5 attempts to call the listed family and contact persons listed for the pt and I have left a  Message on the one phone with an answering machine but I have received no answer and no return phone calls at this point. Pt has been cleared for discharged. Will continue to monitor pt and attempt to get in touch with listed contacts to gain pt a ride home. Home care has been set up and home health equipment has been delivered to the room.

## 2012-09-30 ENCOUNTER — Inpatient Hospital Stay (HOSPITAL_COMMUNITY)
Admission: EM | Admit: 2012-09-30 | Discharge: 2012-10-03 | DRG: 193 | Disposition: A | Discharge status: Home Health | Payer: Self-pay | Attending: Internal Medicine | Admitting: Internal Medicine

## 2012-09-30 ENCOUNTER — Encounter (HOSPITAL_COMMUNITY): Payer: Self-pay | Admitting: *Deleted

## 2012-09-30 ENCOUNTER — Emergency Department (HOSPITAL_COMMUNITY): Payer: Self-pay

## 2012-09-30 DIAGNOSIS — E43 Unspecified severe protein-calorie malnutrition: Secondary | ICD-10-CM

## 2012-09-30 DIAGNOSIS — G8929 Other chronic pain: Secondary | ICD-10-CM | POA: Diagnosis present

## 2012-09-30 DIAGNOSIS — F17200 Nicotine dependence, unspecified, uncomplicated: Secondary | ICD-10-CM | POA: Diagnosis present

## 2012-09-30 DIAGNOSIS — F411 Generalized anxiety disorder: Secondary | ICD-10-CM | POA: Diagnosis present

## 2012-09-30 DIAGNOSIS — Z87440 Personal history of urinary (tract) infections: Secondary | ICD-10-CM

## 2012-09-30 DIAGNOSIS — IMO0002 Reserved for concepts with insufficient information to code with codable children: Secondary | ICD-10-CM | POA: Diagnosis present

## 2012-09-30 DIAGNOSIS — R531 Weakness: Secondary | ICD-10-CM

## 2012-09-30 DIAGNOSIS — E876 Hypokalemia: Secondary | ICD-10-CM

## 2012-09-30 DIAGNOSIS — R5381 Other malaise: Secondary | ICD-10-CM | POA: Diagnosis present

## 2012-09-30 DIAGNOSIS — M79609 Pain in unspecified limb: Secondary | ICD-10-CM | POA: Diagnosis present

## 2012-09-30 DIAGNOSIS — F1010 Alcohol abuse, uncomplicated: Secondary | ICD-10-CM | POA: Diagnosis present

## 2012-09-30 DIAGNOSIS — L0390 Cellulitis, unspecified: Secondary | ICD-10-CM

## 2012-09-30 DIAGNOSIS — Z79899 Other long term (current) drug therapy: Secondary | ICD-10-CM

## 2012-09-30 DIAGNOSIS — N390 Urinary tract infection, site not specified: Secondary | ICD-10-CM

## 2012-09-30 DIAGNOSIS — X58XXXA Exposure to other specified factors, initial encounter: Secondary | ICD-10-CM | POA: Diagnosis present

## 2012-09-30 DIAGNOSIS — S30810A Abrasion of lower back and pelvis, initial encounter: Secondary | ICD-10-CM

## 2012-09-30 DIAGNOSIS — D539 Nutritional anemia, unspecified: Secondary | ICD-10-CM | POA: Diagnosis present

## 2012-09-30 DIAGNOSIS — J189 Pneumonia, unspecified organism: Principal | ICD-10-CM

## 2012-09-30 DIAGNOSIS — D72829 Elevated white blood cell count, unspecified: Secondary | ICD-10-CM

## 2012-09-30 HISTORY — DX: Unspecified osteoarthritis, unspecified site: M19.90

## 2012-09-30 LAB — URINALYSIS, ROUTINE W REFLEX MICROSCOPIC
Glucose, UA: NEGATIVE mg/dL
Hgb urine dipstick: NEGATIVE
pH: 6 (ref 5.0–8.0)

## 2012-09-30 LAB — BASIC METABOLIC PANEL
BUN: 9 mg/dL (ref 6–23)
CO2: 18 mEq/L — ABNORMAL LOW (ref 19–32)
Chloride: 112 mEq/L (ref 96–112)
Creatinine, Ser: 0.67 mg/dL (ref 0.50–1.10)
Glucose, Bld: 114 mg/dL — ABNORMAL HIGH (ref 70–99)

## 2012-09-30 LAB — CBC WITH DIFFERENTIAL/PLATELET
HCT: 30.8 % — ABNORMAL LOW (ref 36.0–46.0)
Hemoglobin: 10.4 g/dL — ABNORMAL LOW (ref 12.0–15.0)
Lymphocytes Relative: 12 % (ref 12–46)
Lymphs Abs: 2.1 10*3/uL (ref 0.7–4.0)
MCV: 104.1 fL — ABNORMAL HIGH (ref 78.0–100.0)
Monocytes Absolute: 1.6 10*3/uL — ABNORMAL HIGH (ref 0.1–1.0)
Monocytes Relative: 9 % (ref 3–12)
Neutro Abs: 13.3 10*3/uL — ABNORMAL HIGH (ref 1.7–7.7)
WBC: 17 10*3/uL — ABNORMAL HIGH (ref 4.0–10.5)

## 2012-09-30 LAB — CBC
MCH: 35.3 pg — ABNORMAL HIGH (ref 26.0–34.0)
MCHC: 33.6 g/dL (ref 30.0–36.0)
MCV: 104.9 fL — ABNORMAL HIGH (ref 78.0–100.0)
Platelets: 402 10*3/uL — ABNORMAL HIGH (ref 150–400)
RDW: 14.4 % (ref 11.5–15.5)
WBC: 19.5 10*3/uL — ABNORMAL HIGH (ref 4.0–10.5)

## 2012-09-30 LAB — RETICULOCYTES
RBC.: 3.09 MIL/uL — ABNORMAL LOW (ref 3.87–5.11)
Retic Ct Pct: 2.6 % (ref 0.4–3.1)

## 2012-09-30 LAB — URINE MICROSCOPIC-ADD ON

## 2012-09-30 LAB — CREATININE, SERUM: GFR calc Af Amer: >90 mL/min (ref >90)

## 2012-09-30 LAB — MAGNESIUM: Magnesium: 1.7 mg/dL (ref 1.5–2.5)

## 2012-09-30 MED ORDER — LEVOFLOXACIN IN D5W 750 MG/150ML IV SOLN
750.0000 mg | INTRAVENOUS | Status: AC
  Administered 2012-09-30 – 2012-10-02 (×3): 750 mg via INTRAVENOUS
  Filled 2012-09-30 (×3): qty 150

## 2012-09-30 MED ORDER — POTASSIUM CHLORIDE CRYS ER 20 MEQ PO TBCR
40.0000 meq | EXTENDED_RELEASE_TABLET | Freq: Once | ORAL | Status: AC
  Administered 2012-09-30: 40 meq via ORAL
  Filled 2012-09-30: qty 2

## 2012-09-30 MED ORDER — DEXTROSE 5 % IV SOLN
1.0000 g | Freq: Three times a day (TID) | INTRAVENOUS | Status: DC
  Administered 2012-10-01 – 2012-10-03 (×8): 1 g via INTRAVENOUS
  Filled 2012-09-30 (×10): qty 1

## 2012-09-30 MED ORDER — ENOXAPARIN SODIUM 40 MG/0.4ML ~~LOC~~ SOLN
40.0000 mg | SUBCUTANEOUS | Status: DC
  Administered 2012-09-30 – 2012-10-02 (×3): 40 mg via SUBCUTANEOUS
  Filled 2012-09-30 (×4): qty 0.4

## 2012-09-30 MED ORDER — VITAMIN B-1 100 MG PO TABS
100.0000 mg | ORAL_TABLET | Freq: Every day | ORAL | Status: DC
  Administered 2012-09-30 – 2012-10-03 (×4): 100 mg via ORAL
  Filled 2012-09-30 (×4): qty 1

## 2012-09-30 MED ORDER — ACETAMINOPHEN 650 MG RE SUPP: 650.0000 mg | Freq: Four times a day (QID) | RECTAL | Status: DC | PRN

## 2012-09-30 MED ORDER — OXYCODONE HCL 5 MG PO TABS
5.0000 mg | ORAL_TABLET | ORAL | Status: DC | PRN
  Administered 2012-09-30 – 2012-10-02 (×5): 5 mg via ORAL
  Filled 2012-09-30 (×5): qty 1

## 2012-09-30 MED ORDER — ONDANSETRON HCL 4 MG PO TABS
4.0000 mg | ORAL_TABLET | Freq: Four times a day (QID) | ORAL | Status: DC | PRN
  Administered 2012-10-03: 4 mg via ORAL
  Filled 2012-09-30: qty 1

## 2012-09-30 MED ORDER — VANCOMYCIN HCL IN DEXTROSE 1-5 GM/200ML-% IV SOLN
1000.0000 mg | INTRAVENOUS | Status: AC
  Administered 2012-09-30: 1000 mg via INTRAVENOUS
  Filled 2012-09-30: qty 200

## 2012-09-30 MED ORDER — DEXTROSE 5 % IV SOLN
500.0000 mg | Freq: Once | INTRAVENOUS | Status: AC
  Administered 2012-09-30: 500 mg via INTRAVENOUS
  Filled 2012-09-30: qty 500

## 2012-09-30 MED ORDER — ACETAMINOPHEN 325 MG PO TABS
650.0000 mg | ORAL_TABLET | Freq: Four times a day (QID) | ORAL | Status: DC | PRN
  Administered 2012-10-01: 650 mg via ORAL
  Filled 2012-09-30: qty 2

## 2012-09-30 MED ORDER — SODIUM CHLORIDE 0.9 % IV SOLN
INTRAVENOUS | Status: DC
  Administered 2012-09-30: 15:00:00 via INTRAVENOUS

## 2012-09-30 MED ORDER — MORPHINE SULFATE 2 MG/ML IJ SOLN
0.5000 mg | INTRAMUSCULAR | Status: DC | PRN
  Administered 2012-10-01: 0.5 mg via INTRAVENOUS
  Filled 2012-09-30: qty 1

## 2012-09-30 MED ORDER — SENNOSIDES-DOCUSATE SODIUM 8.6-50 MG PO TABS
1.0000 | ORAL_TABLET | Freq: Every evening | ORAL | Status: DC | PRN
  Filled 2012-09-30: qty 1

## 2012-09-30 MED ORDER — DEXTROSE 5 % IV SOLN
1.0000 g | Freq: Once | INTRAVENOUS | Status: AC
  Administered 2012-09-30: 1 g via INTRAVENOUS
  Filled 2012-09-30: qty 10

## 2012-09-30 MED ORDER — VANCOMYCIN HCL 1000 MG IV SOLR
750.0000 mg | Freq: Two times a day (BID) | INTRAVENOUS | Status: DC
  Administered 2012-10-01 – 2012-10-03 (×5): 750 mg via INTRAVENOUS
  Filled 2012-09-30 (×6): qty 750

## 2012-09-30 MED ORDER — FOLIC ACID 1 MG PO TABS
1.0000 mg | ORAL_TABLET | Freq: Every day | ORAL | Status: DC
  Administered 2012-09-30 – 2012-10-03 (×4): 1 mg via ORAL
  Filled 2012-09-30 (×4): qty 1

## 2012-09-30 MED ORDER — SODIUM CHLORIDE 0.9 % IJ SOLN
3.0000 mL | Freq: Two times a day (BID) | INTRAMUSCULAR | Status: DC
  Administered 2012-10-01 – 2012-10-02 (×2): 3 mL via INTRAVENOUS

## 2012-09-30 MED ORDER — SODIUM CHLORIDE 0.9 % IV BOLUS (SEPSIS)
500.0000 mL | Freq: Once | INTRAVENOUS | Status: AC
  Administered 2012-09-30: 13:00:00 via INTRAVENOUS

## 2012-09-30 MED ORDER — SODIUM CHLORIDE 0.9 % IV SOLN
INTRAVENOUS | Status: DC
  Administered 2012-10-01 – 2012-10-02 (×2): via INTRAVENOUS

## 2012-09-30 MED ORDER — SODIUM CHLORIDE 0.9 % IV SOLN
INTRAVENOUS | Status: AC
  Administered 2012-09-30: 125 mL/h via INTRAVENOUS

## 2012-09-30 MED ORDER — ONDANSETRON HCL 4 MG/2ML IJ SOLN
4.0000 mg | Freq: Four times a day (QID) | INTRAMUSCULAR | Status: DC | PRN
  Administered 2012-10-01 – 2012-10-03 (×3): 4 mg via INTRAVENOUS
  Filled 2012-09-30 (×3): qty 2

## 2012-09-30 NOTE — Progress Notes (Signed)
ANTIBIOTIC CONSULT NOTE - INITIAL  Pharmacy Consult for Vancomycin, renal adjustment for antibiotics Indication:  Suspected PNA   No Known Allergies  Patient Measurements: Height: 5\' 6"  (167.6 cm) Weight: 126 lb 4.8 oz (57.289 kg) IBW/kg (Calculated) : 59.3   Vital Signs: Temp: 99.3 F (37.4 C) (10/28 1700) Temp src: Oral (10/28 1700) BP: 106/59 mmHg (10/28 1700) Pulse Rate: 105  (10/28 1700) Intake/Output from previous day:   Intake/Output from this shift:    Labs:  Basename 09/30/12 1422  WBC 17.0*  HGB 10.4*  PLT 457*  LABCREA --  CREATININE 0.67   Estimated Creatinine Clearance: 72.7 ml/min (by C-G formula based on Cr of 0.67). No results found for this basename: VANCOTROUGH:2,VANCOPEAK:2,VANCORANDOM:2,GENTTROUGH:2,GENTPEAK:2,GENTRANDOM:2,TOBRATROUGH:2,TOBRAPEAK:2,TOBRARND:2,AMIKACINPEAK:2,AMIKACINTROU:2,AMIKACIN:2, in the last 72 hours   Medical History: Past Medical History  Diagnosis Date  . Hypokalemia   . Anxiety   . Chronic leg pain   . Arthritis     Assessment:  37 yof recently hospitalized for acute alcohol intoxication UTI, LE cellulitis. Pt was discharged on 10/22 with doxycycline for 10 days.  Patient presented 10/28 with weakness, incontinence of bowel and urine.  MD noted cellulitic changes of lower back and buttocks.  CXR with left basilar infiltrate with associated effusion.  Patient received Rocephin and Azithromycin x 1 in the ED. MD now broadening antibiotics to vancomycin x8d, cefepime x8d and Levaquin x3 day per HCAP protocol.   Afeb, WBC 17K, Scr wnl, CG CrCl 73 ml/min, N CrCl 91 ml/min.  Goal of Therapy:  Vancomycin trough level 15-20 mcg/ml Appropriate renal adjustment of other antibiotics  Plan:   Vancomycin 1gm x 1 then 750 mg IV q12h x 8 days   Ok to continue Cefepime 1g IV q8h x8days and levaquin 750 mg Iv q24h x 3days as ordered by MD  Pharmacy will f/u  Geoffry Paradise Thi 09/30/2012,6:36 PM

## 2012-09-30 NOTE — H&P (Addendum)
[facility]          History and Physical    PCP:   Rene Paci, MD   Chief Complaint:  Weakness  HPI: 56 y/o woman recently discharged from the hospital on 10/22 with a diagnosis of a UTI and LE cellulitis. She has a PMH significant for ETOH abuse. Apparently EMS was called out to the house today where the patient was found to be sitting on the couch covered in feces and urine. She states she has just been too weak to get off the couch. Her backside is excoriated and very painful to touch. States her weakness has gotten worse since leaving the hospital. Denies fever, chills, cough, n/v, diarrhea or abdominal pain. We have been asked to admit her for further evaluation and management.  Allergies:  No Known Allergies    Past Medical History  Diagnosis Date  . Hypokalemia   . Anxiety   . Chronic leg pain   . Arthritis     Past Surgical History  Procedure Date  . Cardiac surgery     Prior to Admission medications   Medication Sig Start Date End Date Taking? Authorizing Provider  doxycycline (VIBRA-TABS) 100 MG tablet Take 1 tablet (100 mg total) by mouth 2 (two) times daily. 09/24/12  Yes Alison Murray, MD  HYDROcodone-acetaminophen (VICODIN) 5-500 MG per tablet Take 1 tablet by mouth every 6 (six) hours as needed. pain 09/24/12  Yes Alison Murray, MD  LORazepam (ATIVAN) 1 MG tablet Take 0.5-1 tablets (0.5-1 mg total) by mouth daily as needed. anxiety 09/24/12  Yes Alison Murray, MD  potassium chloride 20 MEQ/15ML (10%) solution Take 20 mEq by mouth daily.   Yes Historical Provider, MD  pregabalin (LYRICA) 75 MG capsule Take 75 mg by mouth 2 (two) times daily.   Yes Historical Provider, MD    Social History:  reports that she has been smoking Cigarettes.  She has been smoking about 1 pack per day. She has never used smokeless tobacco. She reports that she does not drink alcohol or use illicit drugs.  History reviewed. No pertinent family  history.  Review of Systems:  Constitutional: Denies fever, chills, diaphoresis, positive for appetite change and fatigue.  HEENT: Denies photophobia, eye pain, redness, hearing loss, ear pain, congestion, sore throat, rhinorrhea, sneezing, mouth sores, trouble swallowing, neck pain, neck stiffness and tinnitus.   Respiratory: Denies SOB, DOE, cough, chest tightness,  and wheezing.   Cardiovascular: Denies chest pain, palpitations and leg swelling.  Gastrointestinal: Denies nausea, vomiting, abdominal pain, diarrhea, constipation, blood in stool and abdominal distention.  Genitourinary: Denies dysuria, urgency, frequency, hematuria, flank pain and difficulty urinating.  Musculoskeletal: Denies myalgias, back pain, joint swelling, arthralgias and gait problem.  Skin: Denies pallor, rash and wound.  Neurological: Denies dizziness, seizures, syncope,  numbness and headaches.  Hematological: Denies adenopathy. Easy bruising, personal or family bleeding history    Physical Exam: Blood pressure 106/59, pulse 105, temperature 99.3 F (37.4 C), temperature source Oral, resp. rate 18, height 5\' 6"  (1.676 m), weight 57.289 kg (126 lb 4.8 oz), SpO2 99.00%. Gen: AA Ox3, cachectic. HEENT: [place]/AT/PERRL/EOMI/dry mucous membranes. Neck: supple, no JVD, no LAD, no bruits, no goiter. CV: RRR, no M/R/G Lungs: CTA B Abd: S/NT/ND/+BS/no masses or organomegaly Ext: no C/C. Has 1-2+ pitting edema bilaterally. Neuro: non-focal, generally weak. Skin: large area of excoriation surrounding buttocks and lower back. Skin is bright red and painful to touch. No ulcerations.  Labs on Admission:  Results  for orders placed during the hospital encounter of 09/30/12 (from the past 48 hour(s))  CBC WITH DIFFERENTIAL     Status: Abnormal   Collection Time   09/30/12  2:22 PM      Component Value Range Comment   WBC 17.0 (*) 4.0 - 10.5 K/uL    RBC 2.96 (*) 3.87 - 5.11 MIL/uL    Hemoglobin 10.4 (*) 12.0 - 15.0 g/dL     HCT 60.4 (*) 54.0 - 46.0 %    MCV 104.1 (*) 78.0 - 100.0 fL    MCH 35.1 (*) 26.0 - 34.0 pg    MCHC 33.8  30.0 - 36.0 g/dL    RDW 98.1  19.1 - 47.8 %    Platelets 457 (*) 150 - 400 K/uL    Neutrophils Relative 78 (*) 43 - 77 %    Neutro Abs 13.3 (*) 1.7 - 7.7 K/uL    Lymphocytes Relative 12  12 - 46 %    Lymphs Abs 2.1  0.7 - 4.0 K/uL    Monocytes Relative 9  3 - 12 %    Monocytes Absolute 1.6 (*) 0.1 - 1.0 K/uL    Eosinophils Relative 0  0 - 5 %    Eosinophils Absolute 0.1  0.0 - 0.7 K/uL    Basophils Relative 0  0 - 1 %    Basophils Absolute 0.0  0.0 - 0.1 K/uL   BASIC METABOLIC PANEL     Status: Abnormal   Collection Time   09/30/12  2:22 PM      Component Value Range Comment   Sodium 140  135 - 145 mEq/L    Potassium 3.4 (*) 3.5 - 5.1 mEq/L    Chloride 112  96 - 112 mEq/L    CO2 18 (*) 19 - 32 mEq/L    Glucose, Bld 114 (*) 70 - 99 mg/dL    BUN 9  6 - 23 mg/dL    Creatinine, Ser 2.95  0.50 - 1.10 mg/dL    Calcium 8.3 (*) 8.4 - 10.5 mg/dL    GFR calc non Af Amer >90  >90 mL/min    GFR calc Af Amer >90  >90 mL/min   URINALYSIS, ROUTINE W REFLEX MICROSCOPIC     Status: Abnormal   Collection Time   09/30/12  2:59 PM      Component Value Range Comment   Color, Urine ORANGE (*) YELLOW BIOCHEMICALS MAY BE AFFECTED BY COLOR   APPearance CLOUDY (*) CLEAR    Specific Gravity, Urine 1.020  1.005 - 1.030    pH 6.0  5.0 - 8.0    Glucose, UA NEGATIVE  NEGATIVE mg/dL    Hgb urine dipstick NEGATIVE  NEGATIVE    Bilirubin Urine SMALL (*) NEGATIVE    Ketones, ur TRACE (*) NEGATIVE mg/dL    Protein, ur NEGATIVE  NEGATIVE mg/dL    Urobilinogen, UA 0.2  0.0 - 1.0 mg/dL    Nitrite NEGATIVE  NEGATIVE    Leukocytes, UA SMALL (*) NEGATIVE   URINE MICROSCOPIC-ADD ON     Status: Abnormal   Collection Time   09/30/12  2:59 PM      Component Value Range Comment   Squamous Epithelial / LPF FEW (*) RARE    WBC, UA 7-10  <3 WBC/hpf    Bacteria, UA RARE  RARE    Urine-Other MUCOUS PRESENT      ETHANOL     Status: Normal   Collection Time   09/30/12  5:10 PM      Component Value Range Comment   Alcohol, Ethyl (B) <11  0 - 11 mg/dL     Radiological Exams on Admission: Dg Chest Port 1 View  09/30/2012  *RADIOLOGY REPORT*  Clinical Data: Failure to thrive  PORTABLE CHEST - 1 VIEW  Comparison: None.  Findings: The heart and pulmonary vascularity are within normal limits.  A left-sided pleural effusion is noted.  This likely some underlying infiltrate as well.  Postsurgical changes are noted.  No acute bony abnormality is seen.  IMPRESSION: Left basilar infiltrate with associated effusion.   Original Report Authenticated By: Phillips Odor, M.D.     Assessment/Plan Principal Problem:  *Weakness generalized Active Problems:  ETOH abuse  Macrocytic anemia  HCAP (healthcare-associated pneumonia)  Hypokalemia  Leukocytosis  Excoriation of multiple sites of buttock  Protein-calorie malnutrition, severe   Generalized Weakness -Suspect 2/2 generally overall frail state, ETOH'ism and possibly acute component with her PNA. -Check TSH/B12/RPR. -Will get PT/OT to assess. -Suspect she will need SNF given she is taking care of herself poorly at home.  HCAP -Has a LLL PNA on CXR. -Also has leukocytosis and increased weakness. -I think it is prudent to treat this as clinically significant PNA. -Given she was recently in the hospital will treat with levaquin/vanc/cefepime. -Blood/sputum cx. -Strep pneumo/legionella urine antigens ordered.  ETOH abuse -Initially she says she hasn't drunk anything since her DC, but when I ask her how much she drinks daily she says 1-2 glasses of vodka. -Thiamine/folate. -ETOH level is <11, so doubt she will have withdrawals in the hospital.  Macrocytic Anemia -Check B12/folate. -Suspect related to long-term ETOH abuse.  Severe Protein-Caloric Malnutrition -Ensure BID. -Will ask for a nutrition consultation.  Buttocks Excoriation -Mechanical  irritation from sitting in urine and feces. -Barrier cream. -Frequent hygiene.  Hypokalemia -Replete PO. -Check Mag level.  DVT Prophylaxis -Lovenox.   Time Spent on Admission: 70 minutes.  Chaya Jan [facility] Pager: [phone] 09/30/2012, 6:35 PM

## 2012-09-30 NOTE — ED Provider Notes (Signed)
History     CSN: [id number]  Arrival date & time 09/30/12  1242   First MD Initiated Contact with Patient 09/30/12 1316      Chief Complaint  Patient presents with  . Weakness  . Urinary Incontinence  . Encopresis    (Consider location/radiation/quality/duration/timing/severity/associated sxs/prior treatment) HPI.....Marland Kitchen level V caveat for urgent intervention.   Allegedly patient was found at home on the couch in by her daughter. She was weak, incontinent of urine and stools. Not been eating.  Lower back and buttocks red and inflamed.    Past Medical History  Diagnosis Date  . Hypokalemia   . Anxiety   . Chronic leg pain   . Arthritis     Past Surgical History  Procedure Date  . Cardiac surgery     History reviewed. No pertinent family history.  History  Substance Use Topics  . Smoking status: Current Every Day Smoker -- 1.0 packs/day    Types: Cigarettes  . Smokeless tobacco: Never Used  . Alcohol Use: No    OB History    Grav Para Term Preterm Abortions TAB SAB Ect Mult Living                  Review of Systems  Unable to perform ROS   Allergies  Review of patient's allergies indicates no known allergies.  Home Medications   Current Outpatient Rx  Name Route Sig Dispense Refill  . DOXYCYCLINE HYCLATE 100 MG PO TABS Oral Take 1 tablet (100 mg total) by mouth 2 (two) times daily. 20 tablet 0  . HYDROCODONE-ACETAMINOPHEN 5-500 MG PO TABS Oral Take 1 tablet by mouth every 6 (six) hours as needed. pain 30 tablet 0  . LORAZEPAM 1 MG PO TABS Oral Take 0.5-1 tablets (0.5-1 mg total) by mouth daily as needed. anxiety 30 tablet 0  . POTASSIUM CHLORIDE 20 MEQ/15ML (10%) PO LIQD Oral Take 20 mEq by mouth daily.    Marland Kitchen PREGABALIN 75 MG PO CAPS Oral Take 75 mg by mouth 2 (two) times daily.      BP 115/70  Pulse 109  Temp 98.6 F (37 C) (Oral)  Resp 18  SpO2 100%  Physical Exam  Nursing note and vitals reviewed. Constitutional: She is oriented to person,  place, and time. She appears well-developed and well-nourished.       Whimpering, looks dehydrated  HENT:  Head: Normocephalic and atraumatic.  Eyes: Conjunctivae normal and EOM are normal. Pupils are equal, round, and reactive to light.  Neck: Normal range of motion. Neck supple.  Cardiovascular: Normal rate, regular rhythm and normal heart sounds.   Pulmonary/Chest: Effort normal and breath sounds normal.  Abdominal: Soft. Bowel sounds are normal.  Musculoskeletal: Normal range of motion.  Neurological: She is alert and oriented to person, place, and time.  Skin: Skin is warm and dry.       Lower back and buttocks are erythematous and inflamed.  Psychiatric: She has a normal mood and affect.    ED Course  Procedures (including critical care time)  Labs Reviewed  CBC WITH DIFFERENTIAL - Abnormal; Notable for the following:    WBC 17.0 (*)     RBC 2.96 (*)     Hemoglobin 10.4 (*)     HCT 30.8 (*)     MCV 104.1 (*)     MCH 35.1 (*)     Platelets 457 (*)     Neutrophils Relative 78 (*)     Neutro Abs 13.3 (*)  Monocytes Absolute 1.6 (*)     All other components within normal limits  BASIC METABOLIC PANEL - Abnormal; Notable for the following:    Potassium 3.4 (*)     CO2 18 (*)     Glucose, Bld 114 (*)     Calcium 8.3 (*)     All other components within normal limits  URINALYSIS, ROUTINE W REFLEX MICROSCOPIC - Abnormal; Notable for the following:    Color, Urine ORANGE (*)  BIOCHEMICALS MAY BE AFFECTED BY COLOR   APPearance CLOUDY (*)     Bilirubin Urine SMALL (*)     Ketones, ur TRACE (*)     Leukocytes, UA SMALL (*)     All other components within normal limits  URINE MICROSCOPIC-ADD ON - Abnormal; Notable for the following:    Squamous Epithelial / LPF FEW (*)     All other components within normal limits   Dg Chest Port 1 View  09/30/2012  *RADIOLOGY REPORT*  Clinical Data: Failure to thrive  PORTABLE CHEST - 1 VIEW  Comparison: None.  Findings: The heart and  pulmonary vascularity are within normal limits.  A left-sided pleural effusion is noted.  This likely some underlying infiltrate as well.  Postsurgical changes are noted.  No acute bony abnormality is seen.  IMPRESSION: Left basilar infiltrate with associated effusion.   Original Report Authenticated By: Phillips Odor, M.D.      No diagnosis found.    MDM  Cellulitic changes of lower back and buttocks. Additionally chest x-ray shows probable left basilar infiltrate. Will hydrate. IV Rocephin and Zithromax. Admit to general medicine.        Donnetta Hutching, MD 09/30/12 [phone]

## 2012-09-30 NOTE — Progress Notes (Signed)
[facility] ED CM spoke with Darl Pikes, advanced home coordinator, about cm consult Darl Pikes to follow pt if admitted and for concerns related cm consult Cm to assess pt and home situation

## 2012-09-30 NOTE — Progress Notes (Signed)
[facility] ED Cm received a return call from Darl Pikes, [facility] to discuss home health notes since pt d/c on 09/24/12. Darl Pikes spoke with pt prior to d/c and verified contact information Pt was unable to provide a secondary emergency contact number. Darl Pikes confirmed pt had orders for HHPT and aide only [facility] notes indicate pt's phone went straight to voice mail on 09/24/12 and then a call to pcp office, dr Felicity Coyer was placed to verify contact numbers (same as listed for [facility]) on 09/25/12. Cm spoke with pt who state a nurse came out to see her to check her medication but then medications got mixed up again.  Pt reports living with her "uncle" Pt reports contact numbers are correct.  Pt denies physical abuse.  Pt noted to be crying with her lips closed tightly hen cm inquired about what occurred to cause her to be in stool and urine.  Support provided to pt, tears wiped away.  CM spoke with ED RN who confirmed family was present with pt in [facility] ED with her (a female and female) Female states she thought hospital would keep pt on "sunday" Female reports female unable to assist pt

## 2012-09-30 NOTE — ED Notes (Signed)
[id number]<AV> Expected date:<BR> Expected time:<BR> Means of arrival:<BR> Comments:<BR> Weakness, incontinence of bowel and urine

## 2012-09-30 NOTE — ED Notes (Signed)
Per EMS pt was seen a weak ago Sunday for dehydration and swelling of left leg, pt from home w/ daughter for weakness, incont of bowel and bladder since Thursday. Alert w/ some confusion. BP 116/64, HR 110 sinus tach, RR 18, 100% RA, CBG 154. No food since Thursday. Edema present in feet. Pt has been laying in stool and urine since Friday, daughter said she can't get her mother up off couch.

## 2012-09-30 NOTE — ED Notes (Signed)
Called floor to give report, per secretary RN not available and try calling back later.

## 2012-10-01 DIAGNOSIS — R5381 Other malaise: Secondary | ICD-10-CM

## 2012-10-01 LAB — IRON AND TIBC
Saturation Ratios: 36 % (ref 20–55)
TIBC: 59 ug/dL — ABNORMAL LOW (ref 250–470)

## 2012-10-01 LAB — MAGNESIUM: Magnesium: 1.7 mg/dL (ref 1.5–2.5)

## 2012-10-01 LAB — CBC
HCT: 29.9 % — ABNORMAL LOW (ref 36.0–46.0)
Hemoglobin: 9.9 g/dL — ABNORMAL LOW (ref 12.0–15.0)
MCH: 34.4 pg — ABNORMAL HIGH (ref 26.0–34.0)
MCHC: 33.1 g/dL (ref 30.0–36.0)
MCV: 103.8 fL — ABNORMAL HIGH (ref 78.0–100.0)
RDW: 14.4 % (ref 11.5–15.5)

## 2012-10-01 LAB — COMPREHENSIVE METABOLIC PANEL
Albumin: 1.4 g/dL — ABNORMAL LOW (ref 3.5–5.2)
BUN: 9 mg/dL (ref 6–23)
Creatinine, Ser: 0.76 mg/dL (ref 0.50–1.10)
GFR calc Af Amer: >90 mL/min (ref >90)
Glucose, Bld: 83 mg/dL (ref 70–99)
Total Protein: 5.3 g/dL — ABNORMAL LOW (ref 6.0–8.3)

## 2012-10-01 LAB — HIV ANTIBODY (ROUTINE TESTING W REFLEX): HIV: NONREACTIVE

## 2012-10-01 LAB — TSH: TSH: 2.716 u[IU]/mL (ref 0.350–4.500)

## 2012-10-01 LAB — RPR: RPR Ser Ql: NONREACTIVE

## 2012-10-01 LAB — VITAMIN B12: Vitamin B-12: 1219 pg/mL — ABNORMAL HIGH (ref 211–911)

## 2012-10-01 MED ORDER — PRO-STAT SUGAR FREE PO LIQD
30.0000 mL | Freq: Two times a day (BID) | ORAL | Status: DC
  Administered 2012-10-02 (×2): 30 mL via ORAL
  Filled 2012-10-01 (×5): qty 30

## 2012-10-01 MED ORDER — SODIUM CHLORIDE 0.9 % IV BOLUS (SEPSIS)
250.0000 mL | Freq: Once | INTRAVENOUS | Status: AC
  Administered 2012-10-01: 250 mL via INTRAVENOUS

## 2012-10-01 MED ORDER — POTASSIUM CHLORIDE CRYS ER 20 MEQ PO TBCR
40.0000 meq | EXTENDED_RELEASE_TABLET | Freq: Once | ORAL | Status: AC
  Administered 2012-10-01: 40 meq via ORAL
  Filled 2012-10-01: qty 2

## 2012-10-01 NOTE — Progress Notes (Signed)
BP 84/55, HR 98. Pt asymptomatic. MD notified. Will continue to monitor.

## 2012-10-01 NOTE — Progress Notes (Signed)
Pt has 9 beats of SVT at a rate of 161. Pt asymptomatic.BP 95/44 HR 98. MD notified. New orders placed in Epic. Will continue to monior.

## 2012-10-01 NOTE — Progress Notes (Signed)
INITIAL ADULT NUTRITION ASSESSMENT Date: 10/01/2012   Time: 10:54 AM Reason for Assessment: Nutrition Risk   ASSESSMENT: Female 56 y.o.  Dx: Weakness generalized  Hx:  Past Medical History  Diagnosis Date  . Hypokalemia   . Anxiety   . Chronic leg pain   . Arthritis     Related Meds:  Scheduled Meds:   . sodium chloride   Intravenous STAT  . azithromycin  500 mg Intravenous Once  . ceFEPime (MAXIPIME) IV  1 g Intravenous Q8H  . cefTRIAXone (ROCEPHIN)  IV  1 g Intravenous Once  . enoxaparin (LOVENOX) injection  40 mg Subcutaneous Q24H  . folic acid  1 mg Oral Daily  . levofloxacin (LEVAQUIN) IV  750 mg Intravenous Q24H  . potassium chloride  40 mEq Oral Once  . potassium chloride  40 mEq Oral Once  . sodium chloride  250 mL Intravenous Once  . sodium chloride  250 mL Intravenous Once  . sodium chloride  500 mL Intravenous Once  . sodium chloride  3 mL Intravenous Q12H  . thiamine  100 mg Oral Daily  . vancomycin  750 mg Intravenous Q12H  . vancomycin  1,000 mg Intravenous NOW   Continuous Infusions:   . sodium chloride 100 mL/hr at 10/01/12 0858  . DISCONTD: sodium chloride 125 mL/hr at 09/30/12 1437   PRN Meds:.acetaminophen, acetaminophen, morphine injection, ondansetron (ZOFRAN) IV, ondansetron, oxyCODONE, senna-docusate   Ht: 5\' 6"  (167.6 cm)  Wt: 126 lb 4.8 oz (57.289 kg)  Ideal Wt: 59.3 kg  % Ideal Wt: 97% Wt Readings from Last 10 Encounters:  09/30/12 126 lb 4.8 oz (57.289 kg)  09/23/12 118 lb (53.524 kg)    Body mass index is 20.39 kg/(m^2). (WNL)  Food/Nutrition Related Hx: Patient not willing to speak to RD. She reported she does not want anything. Due to alcoholism, patient likely to have poor nutrition quality.   Labs:  CMP     Component Value Date/Time   NA 140 10/01/2012 0505   K 3.5 10/01/2012 0505   CL 113* 10/01/2012 0505   CO2 19 10/01/2012 0505   GLUCOSE 83 10/01/2012 0505   BUN 9 10/01/2012 0505   CREATININE 0.76 10/01/2012  0505   CALCIUM 8.0* 10/01/2012 0505   PROT 5.3* 10/01/2012 0505   ALBUMIN 1.4* 10/01/2012 0505   AST 26 10/01/2012 0505   ALT 13 10/01/2012 0505   ALKPHOS 117 10/01/2012 0505   BILITOT 0.7 10/01/2012 0505   GFRNONAA >90 10/01/2012 0505   GFRAA >90 10/01/2012 0505    Intake/Output Summary (Last 24 hours) at 10/01/12 1055 Last data filed at 10/01/12 0042  Gross per 24 hour  Intake    880 ml  Output      0 ml  Net    880 ml     Diet Order: General  Supplements/Tube Feeding: none at this time   IVF:    sodium chloride Last Rate: 100 mL/hr at 10/01/12 1610  DISCONTD: sodium chloride Last Rate: 125 mL/hr at 09/30/12 1437    Estimated Nutritional Needs:   Kcal: 9604-5409 Protein: 71-88 grams  Fluid: 1 ml per kcal intake   NUTRITION DIAGNOSIS: -Predicted suboptimal energy intake (NI-1.6).  Status: Ongoing -Increased nutrient needs r/t wound healing a/e/b pt with multiple excoriation sites to buttocks.   RELATED TO: ETOH abuse  AS EVIDENCE BY: alcoholism likely to cause history of poor PO intake   MONITORING/EVALUATION(Goals): PO intake, weights, labs 1. PO intake > 75% at meals.  2. Promote weight maintenance.   EDUCATION NEEDS: -No education needs identified at this time  INTERVENTION: 1. Will order patient Prostat BID, provides 200 kcal and 30 grams of protein.  2. RD to follow for nutrition plan of care.  3. Recommend obtain PHOS.   Dietitian [phone]  DOCUMENTATION CODES Per approved criteria  -Not Applicable    Iven [facility] 10/01/2012, 10:54 AM

## 2012-10-01 NOTE — Evaluation (Signed)
Physical Therapy Evaluation Patient Details Name: Fidelis Loth MRN: [id number] DOB: 01/23/1956 Today's Date: 10/01/2012 Time: 4098-1191 PT Time Calculation (min): 23 min  PT Assessment / Plan / Recommendation Clinical Impression  Pt presents with generalized weakness with recent discharge last week for UTI and LE cellulitis.  Noted that pt has large area of excoriation on L hip/buttock and low back area that is causing increased pain and limiting mobility.  Tolerated sitting EOB, however refused all other mobility and states that she is weak and tired.  Educated pt on benefits of getting OOB, however continued to refuse.  Pt will benefit from skilled PT in acute venue to address deficits.  PT recommends SNF for follow up at D/C in order to maximize pt safety.      PT Assessment  Patient needs continued PT services    Follow Up Recommendations  Post acute inpatient;Supervision/Assistance - 24 hour    Does the patient have the potential to tolerate intense rehabilitation   No, Recommend SNF  Barriers to Discharge Decreased caregiver support      Equipment Recommendations  None recommended by PT    Recommendations for Other Services     Frequency Min 3X/week    Precautions / Restrictions Precautions Precautions: Fall Precaution Comments: Pt with excoriated skin over L hip/buttocks and low back area.  Several scabbed areas on LEs and on R arm.  Restrictions Weight Bearing Restrictions: No   Pertinent Vitals/Pain 8/10 "faces" on L hip area      Mobility  Bed Mobility Bed Mobility: Supine to Sit;Sitting - Scoot to [place] of Bed;Sit to Supine;Rolling Right Rolling Right: 1: +2 Total assist Rolling Right: Patient Percentage: 0% Supine to Sit: 1: +2 Total assist;HOB elevated Supine to Sit: Patient Percentage: 0% Sitting - Scoot to Edge of Bed: 1: +2 Total assist Sitting - Scoot to Edge of Bed: Patient Percentage: 0% Sit to Supine: 1: +2 Total assist;HOB flat Sit to Supine:  Patient Percentage: 0% Details for Bed Mobility Assistance: Pt did not assist with transfer and requires total assist for BLEs and trunk in order to sit on EOB. Max cues for participating, self assisting, however pt refused.  Transfers Transfers: Not assessed Ambulation/Gait Ambulation/Gait Assistance: Not tested (comment) Stairs: No Wheelchair Mobility Wheelchair Mobility: No    Shoulder Instructions     Exercises     PT Diagnosis: Difficulty walking;Generalized weakness;Acute pain  PT Problem List: Decreased strength;Decreased activity tolerance;Decreased balance;Decreased mobility;Decreased range of motion;Decreased cognition;Decreased knowledge of use of DME;Decreased safety awareness;Decreased knowledge of precautions;Pain;Decreased skin integrity PT Treatment Interventions: DME instruction;Gait training;Functional mobility training;Therapeutic activities;Therapeutic exercise;Balance training;Patient/family education   PT Goals Acute Rehab PT Goals PT Goal Formulation: With patient Time For Goal Achievement: 10/15/12 Potential to Achieve Goals: Fair Pt will Roll Supine to Right Side: with supervision PT Goal: Rolling Supine to Right Side - Progress: Goal set today Pt will Roll Supine to Left Side: with supervision PT Goal: Rolling Supine to Left Side - Progress: Goal set today Pt will go Supine/Side to Sit: with min assist PT Goal: Supine/Side to Sit - Progress: Goal set today Pt will go Sit to Supine/Side: with min assist PT Goal: Sit to Supine/Side - Progress: Goal set today Pt will go Sit to Stand: with min assist PT Goal: Sit to Stand - Progress: Goal set today Pt will go Stand to Sit: with min assist PT Goal: Stand to Sit - Progress: Goal set today Pt will Ambulate: 16 - 50 feet;with min assist;with least restrictive  assistive device PT Goal: Ambulate - Progress: Goal set today  Visit Information  Last PT Received On: 10/01/12 Assistance Needed: +2    Subjective  Data  Subjective: I'm just weak and tired Patient Stated Goal: i'm going home   Prior Functioning  Home Living Lives With: Family (uncle who works during the day.) Available Help at Discharge: Available PRN/intermittently Type of Home: House Home Access: Stairs to enter Secretary/administrator of Steps: 7 Entrance Stairs-Rails: Right;Left;Can reach both Home Layout: One level Bathroom Shower/Tub: Engineer, manufacturing systems: Standard Home Adaptive Equipment: Straight cane;Shower chair without back;Bedside commode/3-in-1;Walker - rolling Prior Function Level of Independence: Independent with assistive device(s) Able to Take Stairs?: Yes Driving: Yes Vocation: Unemployed Communication Communication: No difficulties Dominant Hand: Right    Cognition  Overall Cognitive Status: No family/caregiver present to determine baseline cognitive functioning Arousal/Alertness: Awake/alert (Simultaneous filing. User may not have seen previous data.) Orientation Level: Appears intact for tasks assessed Behavior During Session: Agitated (Simultaneous filing. User may not have seen previous data.) Cognition - Other Comments: Pt was agitated by presence of therapists stated she just wanted to "lay and rest." Pt has poor judgement and no insight into limitations.    Extremity/Trunk Assessment Right Upper Extremity Assessment RUE ROM/Strength/Tone: Deficits RUE ROM/Strength/Tone Deficits: generalized weakness Left Upper Extremity Assessment LUE ROM/Strength/Tone: Deficits LUE ROM/Strength/Tone Deficits: generalized weakness Right Lower Extremity Assessment RLE ROM/Strength/Tone: Unable to fully assess RLE ROM/Strength/Tone Deficits: Pt with diffuse muscle atrophy, unable to fully assess due to pts lack of participation in therapy.  Left Lower Extremity Assessment LLE ROM/Strength/Tone Deficits: Pt with diffuse muscle atrophy, unable to fully assess due to pts lack of participation in therapy.    Trunk Assessment Trunk Assessment: Kyphotic   Balance Balance Balance Assessed: Yes Static Sitting Balance Static Sitting - Balance Support: Bilateral upper extremity supported;Feet supported Static Sitting - Level of Assistance: 5: Stand by assistance Static Sitting - Comment/# of Minutes: Pt sat EOB x approx 6-7 mins at stand by assist.    End of Session PT - End of Session Activity Tolerance: Treatment limited secondary to agitation Patient left: in bed;with call bell/phone within reach;with bed alarm set Nurse Communication: Mobility status  GP     Page, Meribeth Mattes 10/01/2012, 8:43 AM

## 2012-10-01 NOTE — Evaluation (Signed)
Occupational Therapy Evaluation Patient Details Name: Maria Gregory MRN: [id number] DOB: 1956/05/10 Today's Date: 10/01/2012 Time:  -     OT Assessment / Plan / Recommendation Clinical Impression  Pt is a 56 yo female recently discharged, readmitted 2* inability to care for self at home, generalized weakness. Pt lacks insight into limitations and eval was limited 2* pts lack of participation. Pt will require snf level of care at d/c. Skilled OT indicated to maximize independence with BADLs to  decrease burden of care at next venue.    OT Assessment  Patient needs continued OT Services    Follow Up Recommendations  Skilled nursing facility;Supervision/Assistance - 24 hour    Barriers to Discharge Decreased caregiver support;Inaccessible home environment    Equipment Recommendations  None recommended by PT    Recommendations for Other Services    Frequency  Min 2X/week    Precautions / Restrictions Precautions Precautions: Fall Precaution Comments: Pt with excoriated skin over L hip/buttocks and low back area.  Several scabbed areas on LEs and on R arm.  Restrictions Weight Bearing Restrictions: No   Pertinent Vitals/Pain Pt verbalized pain in L buttock but did not rate. Would not allow therapist to reposition.    ADL  Grooming: Performed;Wash/dry face;Set up Where Assessed - Grooming: Unsupported sitting Lower Body Bathing: Simulated;+2 Total assistance Lower Body Bathing: Patient Percentage: 0% Where Assessed - Lower Body Bathing: Supine, head of bed flat;Rolling right and/or left Lower Body Dressing: Simulated;+2 Total assistance Lower Body Dressing: Patient Percentage: 0% Where Assessed - Lower Body Dressing: Supine, head of bed flat;Rolling right and/or left Toileting - Clothing Manipulation and Hygiene: Simulated;+2 Total assistance Toileting - Clothing Manipulation and Hygiene: Patient Percentage: 0% Where Assessed - Toileting Clothing Manipulation and Hygiene: Supine,  head of bed flat;Rolling right and/or left Transfers/Ambulation Related to ADLs: Feel much of this was volitional. Pt was uncooperative throughout. ADL Comments: Pt refused to stand, transfer or allow therapists to reposition her in the bed.     OT Diagnosis: Generalized weakness  OT Problem List: Decreased activity tolerance;Decreased safety awareness;Decreased knowledge of use of DME or AE;Pain OT Treatment Interventions: Self-care/ADL training;Therapeutic activities;DME and/or AE instruction;Patient/family education   OT Goals Acute Rehab OT Goals OT Goal Formulation: With patient Time For Goal Achievement: 10/15/12 Potential to Achieve Goals: Fair ADL Goals Pt Will Perform Grooming: with supervision;Standing at sink ADL Goal: Grooming - Progress: Goal set today Pt Will Transfer to Toilet: with min assist;3-in-1;Stand pivot transfer ADL Goal: Toilet Transfer - Progress: Goal set today Pt Will Perform Toileting - Clothing Manipulation: with min assist;Sitting on 3-in-1 or toilet;Standing ADL Goal: Toileting - Clothing Manipulation - Progress: Goal set today Pt Will Perform Toileting - Hygiene: with min assist;Sit to stand from 3-in-1/toilet ADL Goal: Toileting - Hygiene - Progress: Goal set today Additional ADL Goal #1: Pt will complete all aspects of bathing and dressing with min/mod A. ADL Goal: Additional Goal #1 - Progress: Goal set today  Visit Information  Assistance Needed: +2    Subjective Data  Subjective: I'm just so weak and tired. Patient Stated Goal: Return home tomorrow.   Prior Functioning     Home Living Lives With: Family (uncle who works during the day.) Available Help at Discharge: Available PRN/intermittently Type of Home: House Home Access: Stairs to enter [employer] of Steps: 7 Entrance Stairs-Rails: Right;Left;Can reach both Home Layout: One level Bathroom Shower/Tub: Engineer, manufacturing systems: Standard Home Adaptive Equipment:  Straight cane;Shower chair without back;Bedside commode/3-in-1;Walker - rolling  Prior Function Level of Independence: Independent with assistive device(s) Able to Take Stairs?: Yes Driving: Yes Vocation: Unemployed Communication Communication: No difficulties Dominant Hand: Right         Vision/Perception     Cognition  Overall Cognitive Status: No family/caregiver present to determine baseline cognitive functioning Arousal/Alertness: Awake/alert (Simultaneous filing. User may not have seen previous data.) Orientation Level: Appears intact for tasks assessed Behavior During Session: Agitated (Simultaneous filing. User may not have seen previous data.) Cognition - Other Comments: Pt was agitated by presence of therapists stated she just wanted to "lay and rest." Pt has poor judgement and no insight into limitations.    Extremity/Trunk Assessment Right Upper Extremity Assessment RUE ROM/Strength/Tone: Deficits RUE ROM/Strength/Tone Deficits: generalized weakness Left Upper Extremity Assessment LUE ROM/Strength/Tone: Deficits LUE ROM/Strength/Tone Deficits: generalized weakness Right Lower Extremity Assessment RLE ROM/Strength/Tone: Unable to fully assess RLE ROM/Strength/Tone Deficits: Pt with diffuse muscle atrophy, unable to fully assess due to pts lack of participation in therapy.  Left Lower Extremity Assessment LLE ROM/Strength/Tone Deficits: Pt with diffuse muscle atrophy, unable to fully assess due to pts lack of participation in therapy.  Trunk Assessment Trunk Assessment: Kyphotic     Mobility Bed Mobility Bed Mobility: Supine to Sit;Sitting - Scoot to [place] of Bed;Sit to Supine;Rolling Right Rolling Right: 1: +2 Total assist Rolling Right: Patient Percentage: 0% Supine to Sit: 1: +2 Total assist;HOB elevated Supine to Sit: Patient Percentage: 0% Sitting - Scoot to Edge of Bed: 1: +2 Total assist Sitting - Scoot to Edge of Bed: Patient Percentage: 0% Sit to  Supine: 1: +2 Total assist;HOB flat Sit to Supine: Patient Percentage: 0% Details for Bed Mobility Assistance: Pt did not assist with transfer and requires total assist for BLEs and trunk in order to sit on EOB. Max cues for participating, self assisting, however pt refused.      Shoulder Instructions     Exercise     Balance Balance Balance Assessed: Yes Static Sitting Balance Static Sitting - Balance Support: Bilateral upper extremity supported;Feet supported Static Sitting - Level of Assistance: 5: Stand by assistance Static Sitting - Comment/# of Minutes: Pt sat EOB x approx 6-7 mins at stand by assist.     End of Session OT - End of Session Activity Tolerance: Treatment limited secondary to agitation Patient left: in bed;with call bell/phone within reach;with bed alarm set Nurse Communication: Mobility status  GO     Maria Gregory A OTR/L [phone] 10/01/2012, 8:42 AM

## 2012-10-01 NOTE — Progress Notes (Signed)
Attempted to place foley. Pt has quite a bit of white creamy discharge. However, pt was unwilling to participate with procedure. Continued to ask for [place] to stop or that she did not want to finish the procedure. Attempted to regroup with pt and encourage her to allow [place] to finish the procedure explaining the benefits of the foley insertion. Pt continued to insist we stop with procedure because she was "weak and it was painful". Cath insertion unsuccessful. Alcario Drought, RN notified and pt comfort measures provided. Peri/anal skin red, raw and irritated. EPC cream and inc cream applied.

## 2012-10-01 NOTE — Progress Notes (Signed)
[facility]             Progress Note   Subjective: Tired. "Just get out of here and let me rest".  Objective: Vital signs in last 24 hours: Temp:  [97.3 F (36.3 C)-99.3 F (37.4 C)] 97.3 F (36.3 C) (10/29 1457) Pulse Rate:  [90-105] 98  (10/29 1457) Resp:  [18-20] 20  (10/29 1457) BP: (84-106)/(44-74) 84/55 mmHg (10/29 1457) SpO2:  [99 %-100 %] 100 % (10/29 1457) Weight:  [57.289 kg (126 lb 4.8 oz)] 57.289 kg (126 lb 4.8 oz) (10/28 1700) Weight change:  Last BM Date:  (pt does not remember)  Intake/Output from previous day: 10/28 0701 - 10/29 0700 In: 880 [P.O.:480; I.V.:250; IV Piggyback:150] Out: -  Total I/O In: 240 [P.O.:240] Out: -    Physical Exam: General: Alert, awake, oriented x3. HEENT: No bruits, no goiter. Heart: Regular rate and rhythm, without murmurs, rubs, gallops. Lungs: Clear to auscultation bilaterally. Abdomen: Soft, nontender, nondistended, positive bowel sounds. Extremities: No clubbing cyanosis or edema with positive pedal pulses. Skin: large area of irritation and excoriation to her buttocks, sacrum and lower back as well as thighs and around the groin.    Lab Results: Basic Metabolic Panel:  Basename 10/01/12 0505 09/30/12 1928 09/30/12 1422  NA 140 -- 140  K 3.5 -- 3.4*  CL 113* -- 112  CO2 19 -- 18*  GLUCOSE 83 -- 114*  BUN 9 -- 9  CREATININE 0.76 0.69 --  CALCIUM 8.0* -- 8.3*  MG 1.7 1.7 --  PHOS -- -- --   Liver Function Tests:  [facility] 10/01/12 0505  AST 26  ALT 13  ALKPHOS 117  BILITOT 0.7  PROT 5.3*  ALBUMIN 1.4*   CBC:  Basename 10/01/12 0505 09/30/12 1928 09/30/12 1422  WBC 15.4* 19.5* --  NEUTROABS -- -- 13.3*  HGB 9.9* 10.9* --  HCT 29.9* 32.4* --  MCV 103.8* 104.9* --  PLT 450* 402* --   Thyroid Function Tests:  Basename 09/30/12 1928  TSH 2.716  T4TOTAL --  FREET4 --  T3FREE --  THYROIDAB --   Anemia Panel:  Basename 09/30/12 1928  VITAMINB12 1219*  FOLATE --  FERRITIN  312*  TIBC 59*  IRON 21*  RETICCTPCT 2.6   Urine Drug Screen: Drugs of Abuse     Component Value Date/Time   LABOPIA POSITIVE* 09/22/2012 1852   COCAINSCRNUR NONE DETECTED 09/22/2012 1852   LABBENZ POSITIVE* 09/22/2012 1852   AMPHETMU NONE DETECTED 09/22/2012 1852   THCU NONE DETECTED 09/22/2012 1852   LABBARB NONE DETECTED 09/22/2012 1852    Alcohol Level:  Basename 09/30/12 1710  ETH <11   Urinalysis:  Basename 09/30/12 1459  COLORURINE ORANGE*  LABSPEC 1.020  PHURINE 6.0  GLUCOSEU NEGATIVE  HGBUR NEGATIVE  BILIRUBINUR SMALL*  KETONESUR TRACE*  PROTEINUR NEGATIVE  UROBILINOGEN 0.2  NITRITE NEGATIVE  LEUKOCYTESUR SMALL*    Recent Results (from the past 240 hour(s))  URINE CULTURE     Status: Normal   Collection Time   09/22/12  6:52 PM      Component Value Range Status Comment   Specimen Description URINE, CLEAN CATCH   Final    Special Requests NONE   Final    Culture  Setup Time 09/23/2012 12:46   Final    Colony Count >=100,000 COLONIES/ML   Final    Culture     Final    Value: Multiple bacterial morphotypes present, none predominant. Suggest appropriate recollection if clinically indicated.  Report Status 09/24/2012 FINAL   Final     Studies/Results: Dg Chest Port 1 View  09/30/2012  *RADIOLOGY REPORT*  Clinical Data: Failure to thrive  PORTABLE CHEST - 1 VIEW  Comparison: None.  Findings: The heart and pulmonary vascularity are within normal limits.  A left-sided pleural effusion is noted.  This likely some underlying infiltrate as well.  Postsurgical changes are noted.  No acute bony abnormality is seen.  IMPRESSION: Left basilar infiltrate with associated effusion.   Original Report Authenticated By: Phillips Odor, M.D.     Medications: Scheduled Meds:   . sodium chloride   Intravenous STAT  . azithromycin  500 mg Intravenous Once  . ceFEPime (MAXIPIME) IV  1 g Intravenous Q8H  . enoxaparin (LOVENOX) injection  40 mg Subcutaneous Q24H  .  feeding supplement  30 mL Oral BID WC  . folic acid  1 mg Oral Daily  . levofloxacin (LEVAQUIN) IV  750 mg Intravenous Q24H  . potassium chloride  40 mEq Oral Once  . potassium chloride  40 mEq Oral Once  . sodium chloride  250 mL Intravenous Once  . sodium chloride  250 mL Intravenous Once  . sodium chloride  3 mL Intravenous Q12H  . thiamine  100 mg Oral Daily  . vancomycin  750 mg Intravenous Q12H  . vancomycin  1,000 mg Intravenous NOW   Continuous Infusions:   . sodium chloride 100 mL/hr at 10/01/12 0858  . DISCONTD: sodium chloride 125 mL/hr at 09/30/12 1437   PRN Meds:.acetaminophen, acetaminophen, morphine injection, ondansetron (ZOFRAN) IV, ondansetron, oxyCODONE, senna-docusate  Assessment/Plan:  Principal Problem:  *Weakness generalized Active Problems:  ETOH abuse  Macrocytic anemia  HCAP (healthcare-associated pneumonia)  Hypokalemia  Leukocytosis  Excoriation of multiple sites of buttock  Protein-calorie malnutrition, severe   Generalized Weakness  -Suspect 2/2 generally overall frail state, ETOH'ism and possibly acute component with her PNA.  -Check TSH/B12 normal.RPR pending.  -Will get PT/OT to assess.  -Suspect she will need SNF given she is taking care of herself poorly at home.   HCAP  -Has a LLL PNA on CXR.  -Also has leukocytosis and increased weakness.  -I think it is prudent to treat this as clinically significant PNA.  -Given she was recently in the hospital will treat with levaquin/vanc/cefepime.  -Blood/sputum cx negative to date. -Strep pneumo/legionella urine antigens ordered.   ETOH abuse  -Initially she says she hasn't drunk anything since her DC, but when I ask her how much she drinks daily she says 1-2 glasses of vodka.  -Thiamine/folate.  -ETOH level is <11, so doubt she will have withdrawals in the hospital.   Macrocytic Anemia  -Check B12/folate.  -Suspect related to long-term ETOH abuse.   Severe Protein-Caloric Malnutrition   -Ensure BID.  -Will ask for a nutrition consultation.   Buttocks Excoriation  -Mechanical irritation from sitting in urine and feces.  -Barrier cream.  -Frequent hygiene.   Hypokalemia  -Replete PO.  -Mag ok.  DVT Prophylaxis  -Lovenox.   Disposition -Very unsafe for DC home. -We need to find an alternate plan for her. -I believe SNF would be ideal, but I have been informed she does not have insurance. -CM/SW aware and looking into options with her.    Time spent coordinating care: 35 minutes.     LOS: 1 day   [facility] Pager: [phone] 10/01/2012, 3:17 PM

## 2012-10-02 DIAGNOSIS — L0291 Cutaneous abscess, unspecified: Secondary | ICD-10-CM

## 2012-10-02 LAB — FOLATE: Folate: UNDETERMINED ng/mL

## 2012-10-02 LAB — LEGIONELLA ANTIGEN, URINE

## 2012-10-02 NOTE — Care Management Note (Unsigned)
    Page 1 of 2   10/03/2012     1:58:28 PM   CARE MANAGEMENT NOTE 10/03/2012  Patient:  Maria Gregory, Maria Gregory   Account Number:  [id number]  Date Initiated:  10/02/2012  Documentation initiated by:  Lanier Clam  Subjective/Objective Assessment:   ADMITTED W/WEAKNESS.READMIT 10/20-10/22-UTI.     Action/Plan:   FROM HOME W/UNCLE.HAS  PCP,RW.   Anticipated DC Date:  10/03/2012   Anticipated DC Plan:  HOME W HOME HEALTH SERVICES      DC Planning Services  CM consult  Medication Assistance      Choice offered to / List presented to:  C-1 Patient        HH arranged  HH-1 RN  HH-2 PT  HH-3 OT  HH-4 NURSE'S AIDE  HH-6 SOCIAL WORKER      HH agency  [facility]   Status of service:  Completed, signed off Medicare Important Message given?   (If response is "NO", the following Medicare IM given date fields will be blank) Date Medicare IM given:   Date Additional Medicare IM given:    Discharge Disposition:  HOME W HOME HEALTH SERVICES  Per UR Regulation:  Reviewed for med. necessity/level of care/duration of stay  If discussed at Long Length of Stay Meetings, dates discussed:    Comments:  10/03/12 Dontrell Stuck RN,BSN NCM [phone] [facility] INFORMED OF D/C W/HH.IF INDIGENT FUNDS NEEDED CAN PROVIDE.  10/02/12 Ayuub Penley RN,BSN NCM [phone] TC [facility] SUSAN(LIASON) ABOUT CONCERNS OF HH.[facility] HAD NOT MADE AN EVAL VISIT SINCE TEL# WAS INCORRECT,& UNABLE TO GET INTO HOME.PATIENT STATES HER CURRENT TEL#[phone] IS ACCURATE.PT/OT-SNF.NO INSURANCE,& FINANCIAL COUNSELOR HAS SEEN PATIENT.[facility] FOLLOWING FOR HH.RECOMMEND HHRN/PT/OT/AIDE/SW.QUALIFIES FOR INDIGENT FUNDS IF NEEDED.PROVIDED W/COMMUNITY RESOURCES.SHE HAS OWN TRANSP @ D/C.

## 2012-10-02 NOTE — Progress Notes (Signed)
Physical Therapy Treatment Patient Details Name: Maria Gregory MRN: [id number] DOB: 1956-04-04 Today's Date: 10/02/2012 Time: 4098-1191 PT Time Calculation (min): 28 min  PT Assessment / Plan / Recommendation Comments on Treatment Session  Pt found to have fecal incontinence upon PT/OT arrival.  Assited pt with clean up, however pt very agitated and wanting to lay back down.  Once in standing, noted that pt is starting to have increased skin breakdown with open bleeding areas.  RN notified.      Follow Up Recommendations  Post acute inpatient;Supervision/Assistance - 24 hour     Does the patient have the potential to tolerate intense rehabilitation  No, Recommend SNF  Barriers to Discharge        Equipment Recommendations  None recommended by PT    Recommendations for Other Services    Frequency Min 3X/week   Plan Discharge plan remains appropriate    Precautions / Restrictions Precautions Precautions: Fall Precaution Comments: Pt with excoriated skin over L hip/buttocks and low back area. Several scabbed areas on LEs and on R arm. Restrictions Weight Bearing Restrictions: No   Pertinent Vitals/Pain Pain in L hip/buttock area with noted areas of increased skin breakdown with bleeding.  RN notified.     Mobility  Bed Mobility Bed Mobility: Supine to Sit;Sitting - Scoot to Edge of Bed Supine to Sit: 2: Max assist Sitting - Scoot to [place] of Bed: 3: Mod assist Details for Bed Mobility Assistance: Max assist for BLEs off of bed and for trunk to attain sitting position.  Max cues for for safety, technique and hand placement.  Pt with increased difficulty following any commands.   Transfers Transfers: Sit to Stand;Stand to Sit;Stand Pivot Transfers Sit to Stand: 3: Mod assist;From elevated surface;With upper extremity assist;From bed Stand to Sit: 3: Mod assist;With upper extremity assist;With armrests;To chair/3-in-1 Stand Pivot Transfers: 1: +2 Total assist Stand Pivot  Transfers: Patient Percentage: 50% Details for Transfer Assistance: Pt requires +2 assist for safety with MAX cues for safety, hand placement on RW, sequencing/technique.  Pt with NO insight to safety.      Exercises     PT Diagnosis:    PT Problem List:   PT Treatment Interventions:     PT Goals Acute Rehab PT Goals PT Goal Formulation: With patient Time For Goal Achievement: 10/15/12 Potential to Achieve Goals: Fair Pt will go Supine/Side to Sit: with min assist PT Goal: Supine/Side to Sit - Progress: Progressing toward goal Pt will go Sit to Stand: with min assist PT Goal: Sit to Stand - Progress: Progressing toward goal Pt will go Stand to Sit: with min assist PT Goal: Stand to Sit - Progress: Progressing toward goal Pt will Ambulate: 16 - 50 feet;with min assist;with least restrictive assistive device PT Goal: Ambulate - Progress: Progressing toward goal  Visit Information  Last PT Received On: 10/02/12 Assistance Needed: +2    Subjective Data  Subjective: I don't want to sit in that chair.  Patient Stated Goal: i'm going home   Cognition  Overall Cognitive Status: Impaired Area of Impairment: Following commands;Safety/judgement;Awareness of deficits;Awareness of errors Arousal/Alertness: Awake/alert Orientation Level: Appears intact for tasks assessed Behavior During Session: Agitated Following Commands: Follows one step commands inconsistently Safety/Judgement: Decreased awareness of safety precautions;Decreased safety judgement for tasks assessed;Decreased awareness of need for assistance Awareness of Errors: Assistance required to identify errors made;Assistance required to correct errors made Cognition - Other Comments: Pt lacks any insight into limitations and will not adhere to therapists safety  precautions.    Balance  Balance Balance Assessed: Yes Static Standing Balance Static Standing - Balance Support: Bilateral upper extremity supported;During  functional activity Static Standing - Level of Assistance: 3: Mod assist Static Standing - Comment/# of Minutes: Pt able to stand with RW and mod assist x approx 5 mins to assist with cleaning pt due to fecal incontinence.    End of Session PT - End of Session Activity Tolerance: Treatment limited secondary to agitation Patient left: in chair;with call bell/phone within reach;with chair alarm set Nurse Communication: Mobility status   GP     Page, Meribeth Mattes 10/02/2012, 12:23 PM

## 2012-10-02 NOTE — Progress Notes (Signed)
Occupational Therapy Treatment Patient Details Name: Maria Gregory MRN: [id number] DOB: 05/11/1956 Today's Date: 10/02/2012 Time: 4696-2952 OT Time Calculation (min): 29 min  OT Assessment / Plan / Recommendation Comments on Treatment Session Pt was highly resistant to tx. NT stated she won't let staff touch her. Pt covered in stool upon arrival and required MAX encouragement to get cleaned up. Pt lacks any insight into situation. Highly recommend snf at d/c.    Follow Up Recommendations  Skilled nursing facility;Supervision/Assistance - 24 hour    Barriers to Discharge       Equipment Recommendations  3 in 1 bedside comode    Recommendations for Other Services    Frequency Min 2X/week   Plan Discharge plan remains appropriate    Precautions / Restrictions Precautions Precautions: Fall Precaution Comments: Pt with excoriated skin over L hip/buttocks and low back area. Several scabbed areas on LEs and on R arm. Restrictions Weight Bearing Restrictions: No   Pertinent Vitals/Pain Pt denied pain but grunted and groaned throughout. Repositioned for comfort.    ADL  Toilet Transfer: Simulated;+2 Total assistance Toilet Transfer: Patient Percentage: 50% Toilet Transfer Method: Stand pivot Toilet Transfer Equipment: Other (comment) (to recliner.) Toileting - Clothing Manipulation and Hygiene: Performed;+2 Total assistance (50% for sit<>stand. 0% for task itself.) Toileting - Clothing Manipulation and Hygiene: Patient Percentage: 0% Equipment Used: Rolling walker ADL Comments: Pt found lying in stool. Initially refused to allow therapists to get her up to be cleaned. Pt extremely uncooperative, unmotivated to complete any ADLs and mobility. MAX ENCOURAGEMENT NEEDED TO PARTICIPATE. Pt also with several broken areas of skin on buttocks, peri area. RN made aware    OT Diagnosis:    OT Problem List:   OT Treatment Interventions:     OT Goals ADL Goals ADL Goal: Toilet Transfer -  Progress: Not progressing ADL Goal: Toileting - Clothing Manipulation - Progress: Not progressing ADL Goal: Toileting - Hygiene - Progress: Not progressing ADL Goal: Additional Goal #1 - Progress: Not progressing  Visit Information  Last OT Received On: 10/02/12 Assistance Needed: +2 PT/OT Co-Evaluation/Treatment: Yes    Subjective Data  Subjective: Just leave me here in it... (Pt had been lying in stool)   Prior Functioning       Cognition  Overall Cognitive Status: Impaired Area of Impairment: Following commands;Safety/judgement;Awareness of deficits;Awareness of errors Arousal/Alertness: Awake/alert Orientation Level: Appears intact for tasks assessed Behavior During Session: Agitated Following Commands: Follows one step commands inconsistently Safety/Judgement: Decreased awareness of safety precautions;Decreased safety judgement for tasks assessed;Decreased awareness of need for assistance Awareness of Errors: Assistance required to identify errors made;Assistance required to correct errors made Cognition - Other Comments: Pt lacks any insight into limitations and will not adhere to therapists safety precautions.    Mobility  Shoulder Instructions Bed Mobility Bed Mobility: Supine to Sit;Sitting - Scoot to Edge of Bed Supine to Sit: 2: Max assist Sitting - Scoot to [place] of Bed: 3: Mod assist Details for Bed Mobility Assistance: Max assist for BLEs off of bed and for trunk to attain sitting position.  Max cues for for safety, technique and hand placement.  Pt with increased difficulty following any commands.   Transfers Sit to Stand: 3: Mod assist;From elevated surface;With upper extremity assist;From bed Stand to Sit: 3: Mod assist;With upper extremity assist;With armrests;To chair/3-in-1 Details for Transfer Assistance: Pt requires +2 assist for safety with MAX cues for safety, hand placement on RW, sequencing/technique.  Pt with NO insight to safety.  Exercises       Balance Balance Balance Assessed: Yes Static Standing Balance Static Standing - Balance Support: Bilateral upper extremity supported;During functional activity Static Standing - Level of Assistance: 3: Mod assist Static Standing - Comment/# of Minutes: Pt able to stand with RW and mod assist x approx 5 mins to assist with cleaning pt due to fecal incontinence.     End of Session OT - End of Session Activity Tolerance: Treatment limited secondary to agitation Patient left: in chair;with call bell/phone within reach;with chair alarm set  GO     Maria Gregory A 10/02/2012, 12:24 PM

## 2012-10-02 NOTE — Progress Notes (Signed)
Patient ID: Maria Gregory, female   DOB: [birth year], 56 y.o.   MRN: [id number]  [facility] PROGRESS NOTE  Maria Gregory WUJ:[id number] DOB: 03-01-1956 DOA: 09/30/2012 PCP: Rene Paci, MD  Brief narrative: Pt is 56 y/o female with history of alcohol abuse, recently discharged from the hospital on 10/22 with a diagnosis of a UTI and LE cellulitis and now admitted to [facility] after found at home minimally responsive. She was admitted 09/30/2012 for further evaluation and currently being treated for HCAP.  Principal Problem: Generalized Weakness  - Suspect 2/2 generally overall frail state, alcohol abuse, and possibly acute component with her PNA.  - Check TSH/B12 normal.RPR non reactive - PT evaluation done and recommends 24 hour supervision, pt refusing SNF HCAP  - Has a LLL PNA on CXR.  - Also has leukocytosis and increased weakness.  - Plan to transition to PO antibiotics today  - Blood/sputum cx negative to date.  - Strep pneumo/legionella urine antigens pending ETOH abuse  - actively drinking but unable to quantify as pt is unreliable  - Thiamine/folate.  - ETOH level is <11 upon admission Macrocytic Anemia  - Suspect related to long-term ETOH abuse.  - CBC in AM Severe Protein-Caloric Malnutrition  - Ensure BID.  - nutrition consultation.  Buttocks Excoriation  - Mechanical irritation from sitting in urine and feces.  - Barrier cream.  - Frequent hygiene.  Hypokalemia  - supplemented - BMP in AM DVT Prophylaxis  - Lovenox.  Disposition  - Very unsafe for DC home.  - PT refusing placement and has no insurance - will plan on home health PT  Consultants:  PT  Procedures/Studies: Dg Chest Port 1 View 09/30/2012 IMPRESSION: Left basilar infiltrate with associated effusion.  Antibiotics:  Vancomycin 10/28 -->  Maxipime 10/28 -->   Levaquin 10/28 -->  Code Status: Full Family Communication: Pt at bedside Disposition Plan: Home in 1-2  days  HPI/Subjective: No events overnight.   Objective: Filed Vitals:   10/01/12 0730 10/01/12 1457 10/01/12 2113 10/02/12 0618  BP: 95/44 84/55 93/59  97/59  Pulse: 98 98 106 93  Temp:  97.3 F (36.3 C) 98.5 F (36.9 C) 97.7 F (36.5 C)  TempSrc:  Oral Oral Oral  Resp:  20 16 20   Height:      Weight:      SpO2:  100% 100% 100%    Intake/Output Summary (Last 24 hours) at 10/02/12 1223 Last data filed at 10/02/12 0620  Gross per 24 hour  Intake 3331.34 ml  Output    450 ml  Net 2881.34 ml    Exam:   General:  Pt is alert, follows commands appropriately, not in acute distress  Cardiovascular: Regular rate and rhythm, S1/S2, no murmurs, no rubs, no gallops  Respiratory: Clear to auscultation bilaterally, no wheezing, no crackles, no rhonchi  Abdomen: Soft, non tender, non distended, bowel sounds present, no guarding  Extremities: No edema, pulses DP and PT palpable bilaterally  Neuro: Grossly nonfocal  Data Reviewed: Basic Metabolic Panel:  Lab 10/01/12 9562 09/30/12 1928 09/30/12 1422  NA 140 -- 140  K 3.5 -- 3.4*  CL 113* -- 112  CO2 19 -- 18*  GLUCOSE 83 -- 114*  BUN 9 -- 9  CREATININE 0.76 0.69 0.67  CALCIUM 8.0* -- 8.3*  MG 1.7 1.7 --  PHOS -- -- --   Liver Function Tests:  Lab 10/01/12 0505  AST 26  ALT 13  ALKPHOS 117  BILITOT 0.7  PROT 5.3*  ALBUMIN  1.4*   No results found for this basename: LIPASE:5,AMYLASE:5 in the last 168 hours No results found for this basename: AMMONIA:5 in the last 168 hours CBC:  Lab 10/01/12 0505 09/30/12 1928 09/30/12 1422  WBC 15.4* 19.5* 17.0*  NEUTROABS -- -- 13.3*  HGB 9.9* 10.9* 10.4*  HCT 29.9* 32.4* 30.8*  MCV 103.8* 104.9* 104.1*  PLT 450* 402* 457*   Cardiac Enzymes: No results found for this basename: CKTOTAL:5,CKMB:5,CKMBINDEX:5,TROPONINI:5 in the last 168 hours BNP: No components found with this basename: POCBNP:5 CBG: No results found for this basename: GLUCAP:5 in the last 168  hours  Recent Results (from the past 240 hour(s))  URINE CULTURE     Status: Normal   Collection Time   09/22/12  6:52 PM      Component Value Range Status Comment   Specimen Description URINE, CLEAN CATCH   Final    Special Requests NONE   Final    Culture  Setup Time 09/23/2012 12:46   Final    Colony Count >=100,000 COLONIES/ML   Final    Culture     Final    Value: Multiple bacterial morphotypes present, none predominant. Suggest appropriate recollection if clinically indicated.   Report Status 09/24/2012 FINAL   Final   CULTURE, BLOOD (ROUTINE X 2)     Status: Normal (Preliminary result)   Collection Time   09/30/12  7:07 PM      Component Value Range Status Comment   Specimen Description BLOOD LEFT ARM   Final    Special Requests BOTTLES DRAWN AEROBIC ONLY 3CC   Final    Culture  Setup Time 10/01/2012 00:33   Final    Culture     Final    Value:        BLOOD CULTURE RECEIVED NO GROWTH TO DATE CULTURE WILL BE HELD FOR 5 DAYS BEFORE ISSUING A FINAL NEGATIVE REPORT   Report Status PENDING   Incomplete   CULTURE, BLOOD (ROUTINE X 2)     Status: Normal (Preliminary result)   Collection Time   09/30/12  7:17 PM      Component Value Range Status Comment   Specimen Description BLOOD LEFT HAND   Final    Special Requests BOTTLES DRAWN AEROBIC ONLY 1.5CC   Final    Culture  Setup Time 10/01/2012 00:33   Final    Culture     Final    Value:        BLOOD CULTURE RECEIVED NO GROWTH TO DATE CULTURE WILL BE HELD FOR 5 DAYS BEFORE ISSUING A FINAL NEGATIVE REPORT   Report Status PENDING   Incomplete      Scheduled Meds:   . ceFEPime (MAXIPIME) IV  1 g Intravenous Q8H  . enoxaparin (LOVENOX) injection  40 mg Subcutaneous Q24H  . feeding supplement  30 mL Oral BID WC  . folic acid  1 mg Oral Daily  . levofloxacin (LEVAQUIN) IV  750 mg Intravenous Q24H  . sodium chloride  3 mL Intravenous Q12H  . thiamine  100 mg Oral Daily  . vancomycin  750 mg Intravenous Q12H   Continuous  Infusions:   . sodium chloride 100 mL/hr at 10/02/12 0204     Debbora Presto, MD  [facility] Pager [phone]  If 7PM-7AM, please contact night-coverage [email or web address] Password [email or web address] 10/02/2012, 12:23 PM   LOS: 2 days

## 2012-10-03 LAB — BASIC METABOLIC PANEL
CO2: 12 mEq/L — ABNORMAL LOW (ref 19–32)
Calcium: 8.1 mg/dL — ABNORMAL LOW (ref 8.4–10.5)
Creatinine, Ser: 0.74 mg/dL (ref 0.50–1.10)
GFR calc Af Amer: >90 mL/min (ref >90)
Sodium: 138 mEq/L (ref 135–145)

## 2012-10-03 LAB — CBC
MCH: 34.7 pg — ABNORMAL HIGH (ref 26.0–34.0)
MCV: 104.2 fL — ABNORMAL HIGH (ref 78.0–100.0)
Platelets: 353 10*3/uL (ref 150–400)
RBC: 3.11 MIL/uL — ABNORMAL LOW (ref 3.87–5.11)
RDW: 14.3 % (ref 11.5–15.5)
WBC: 10.7 10*3/uL — ABNORMAL HIGH (ref 4.0–10.5)

## 2012-10-03 MED ORDER — LEVOFLOXACIN 500 MG PO TABS: 500.0000 mg | ORAL_TABLET | Freq: Every day | ORAL | Status: AC

## 2012-10-03 NOTE — Progress Notes (Signed)
Pt had 20 beat run of SVT. Pt lying in bed, asymptomatic. VSS:  BP 108/58, HR 98.  Pt in NAD. Notified K.Kirby, NP on call and no new orders received.  Will continue to monitor pt.

## 2012-10-03 NOTE — Discharge Summary (Signed)
Physician Discharge Summary  Maria Gregory YQM:[id number] DOB: 06/24/1956 DOA: 09/30/2012  PCP: Rene Paci, MD  Admit date: 09/30/2012 Discharge date: 10/03/2012  Recommendations for Outpatient Follow-up:  1. Pt will need to follow up with PCP in 2-3 weeks post discharge 2. Please obtain BMP to evaluate electrolytes and kidney function 3. Please also check CBC to evaluate Hg and Hct levels 4. Please note that PT recommendation was have pt discharged to SNF but pt has refused and accepted to go home with HHPT, RN and SW 5. Pt was discharged on Levaquin to complete the therapy for 7 more days post discharge and prescription was provided  6. HH PT, RN, SW  Discharge Diagnoses: Hospital acquired pneumonia Principal Problem:  *Weakness generalized Active Problems:  ETOH abuse  Macrocytic anemia  HCAP (healthcare-associated pneumonia)  Hypokalemia  Leukocytosis  Excoriation of multiple sites of buttock  Protein-calorie malnutrition, severe  Discharge Condition: Stable  Diet recommendation: Heart healthy diet discussed in details   Brief narrative:  Pt is 56 y/o female with history of alcohol abuse, recently discharged from the hospital on 10/22 with a diagnosis of a UTI and LE cellulitis and now admitted to [facility] after found at home minimally responsive. She was admitted 09/30/2012 for further evaluation and currently being treated for HCAP.   Principal Problem:  Generalized Weakness  - Suspect 2/2 generally overall frail state, alcohol abuse, and possibly acute component with her PNA.  - Check TSH/B12 normal.RPR non reactive  - PT evaluation done and recommends 24 hour supervision, pt refusing SNF  - pt will go home with HH PT, RN, SW HCAP  - Has a LLL PNA on CXR.  - Also has leukocytosis and increased weakness.  - Plan to transition to PO antibiotics today and will prescribe oral Levaquin and pt will need to complete the therapy for 7 more days - Blood/sputum cx negative to  date.  - Strep pneumo/legionella urine antigens negative ETOH abuse  - actively drinking but unable to quantify as pt is unreliable  - Thiamine/folate.  - ETOH level is <11 upon admission  - no withdrawal during the hospital stay Macrocytic Anemia  - Suspect related to long-term ETOH abuse.  Severe Protein-Caloric Malnutrition  - Ensure BID.  - nutrition consultation provided Buttocks Excoriation  - Mechanical irritation from sitting in urine and feces.  - Barrier cream applied daily  - Frequent hygiene recommended  Hypokalemia  - supplemented  - BMP this morning indicated normal potassium  DVT Prophylaxis  - Lovenox while in hospital, ambulation encouraged at home  Disposition  - Very unsafe for DC home.  - PT refusing placement and has no insurance  - will plan on home health PT   Consultants:  PT  Procedures/Studies:  Dg Chest Port 1 View  09/30/2012  IMPRESSION: Left basilar infiltrate with associated effusion.   Antibiotics:  Vancomycin 10/28 --> 10/31 Maxipime 10/28 --> 11/07 Levaquin 10/28 --> 10/31  Discharge Exam: Filed Vitals:   10/03/12 0322  BP: 108/58  Pulse: 97  Temp: 97.5 F (36.4 C)  Resp: 20   Filed Vitals:   10/02/12 0618 10/02/12 1542 10/02/12 2228 10/03/12 0322  BP: 97/59 109/65 96/73 108/58  Pulse: 93 103 95 97  Temp: 97.7 F (36.5 C) 98.7 F (37.1 C) 98.6 F (37 C) 97.5 F (36.4 C)  TempSrc: Oral Oral Oral Oral  Resp: 20 19 16 20   Height:      Weight:      SpO2: 100% 99%  100% 100%    General: Pt is alert, follows commands appropriately, not in acute distress Cardiovascular: Regular rate and rhythm, S1/S2 +, no murmurs, no rubs, no gallops Respiratory: Clear to auscultation bilaterally, no wheezing, no crackles, no rhonchi Abdominal: Soft, non tender, non distended, bowel sounds +, no guarding Extremities: no edema, no cyanosis, pulses palpable bilaterally DP and PT Neuro: Grossly nonfocal  Discharge  Instructions  Discharge Orders    Future Orders Please Complete By Expires   Diet - low sodium heart healthy      Increase activity slowly          Medication List     As of 10/03/2012 11:27 AM    STOP taking these medications         doxycycline 100 MG tablet   Commonly known as: VIBRA-TABS      TAKE these medications         HYDROcodone-acetaminophen 5-500 MG per tablet   Commonly known as: VICODIN   Take 1 tablet by mouth every 6 (six) hours as needed. pain      levofloxacin 500 MG tablet   Commonly known as: LEVAQUIN   Take 1 tablet (500 mg total) by mouth daily.      LORazepam 1 MG tablet   Commonly known as: ATIVAN   Take 0.5-1 tablets (0.5-1 mg total) by mouth daily as needed. anxiety      potassium chloride 20 MEQ/15ML (10%) solution   Take 20 mEq by mouth daily.      pregabalin 75 MG capsule   Commonly known as: LYRICA   Take 75 mg by mouth 2 (two) times daily.           Follow-up Information    Follow up with Rene Paci, MD. In 2 weeks.   Contact information:   [address] [place] [address] [phone]           The results of significant diagnostics from this hospitalization (including imaging, microbiology, ancillary and laboratory) are listed below for reference.     Microbiology: Recent Results (from the past 240 hour(s))  CULTURE, BLOOD (ROUTINE X 2)     Status: Normal (Preliminary result)   Collection Time   09/30/12  7:07 PM      Component Value Range Status Comment   Specimen Description BLOOD LEFT ARM   Final    Special Requests BOTTLES DRAWN AEROBIC ONLY 3CC   Final    Culture  Setup Time 10/01/2012 00:33   Final    Culture     Final    Value:        BLOOD CULTURE RECEIVED NO GROWTH TO DATE CULTURE WILL BE HELD FOR 5 DAYS BEFORE ISSUING A FINAL NEGATIVE REPORT   Report Status PENDING   Incomplete   CULTURE, BLOOD (ROUTINE X 2)     Status: Normal (Preliminary result)   Collection Time    09/30/12  7:17 PM      Component Value Range Status Comment   Specimen Description BLOOD LEFT HAND   Final    Special Requests BOTTLES DRAWN AEROBIC ONLY 1.5CC   Final    Culture  Setup Time 10/01/2012 00:33   Final    Culture     Final    Value:        BLOOD CULTURE RECEIVED NO GROWTH TO DATE CULTURE WILL BE HELD FOR 5 DAYS BEFORE ISSUING A FINAL NEGATIVE REPORT   Report Status  PENDING   Incomplete      Labs: Basic Metabolic Panel:  Lab 10/03/12 8295 10/01/12 0505 09/30/12 1928 09/30/12 1422  NA 138 140 -- 140  K 4.4 3.5 -- 3.4*  CL 114* 113* -- 112  CO2 12* 19 -- 18*  GLUCOSE 66* 83 -- 114*  BUN 8 9 -- 9  CREATININE 0.74 0.76 0.69 0.67  CALCIUM 8.1* 8.0* -- 8.3*  MG -- 1.7 1.7 --  PHOS -- -- -- --   Liver Function Tests:  Lab 10/01/12 0505  AST 26  ALT 13  ALKPHOS 117  BILITOT 0.7  PROT 5.3*  ALBUMIN 1.4*   No results found for this basename: LIPASE:5,AMYLASE:5 in the last 168 hours No results found for this basename: AMMONIA:5 in the last 168 hours CBC:  Lab 10/03/12 0545 10/01/12 0505 09/30/12 1928 09/30/12 1422  WBC 10.7* 15.4* 19.5* 17.0*  NEUTROABS -- -- -- 13.3*  HGB 10.8* 9.9* 10.9* 10.4*  HCT 32.4* 29.9* 32.4* 30.8*  MCV 104.2* 103.8* 104.9* 104.1*  PLT 353 450* 402* 457*   Cardiac Enzymes: No results found for this basename: CKTOTAL:5,CKMB:5,CKMBINDEX:5,TROPONINI:5 in the last 168 hours BNP: BNP (last 3 results) No results found for this basename: PROBNP:3 in the last 8760 hours CBG: No results found for this basename: GLUCAP:5 in the last 168 hours   SIGNED: Time coordinating discharge: Over 30 minutes  Debbora Presto, MD  [facility] 10/03/2012, 11:27 AM Pager [phone]  If 7PM-7AM, please contact night-coverage [email or web address] Password [email or web address]

## 2012-10-07 LAB — CULTURE, BLOOD (ROUTINE X 2): Culture: NO GROWTH

## 2012-10-09 NOTE — Progress Notes (Signed)
[facility] ED Cm received a return call from [facility] coordinator, susan updating cm that [facility] staff unable to reach pt via contact numbers that pt verified with ED CM and unit cm Darl Pikes also reported that Dr Felicity Coyer, Vikki Ports listed in [facility] reports that this pt is not an active pt when [facility] staff called to check

## 2014-07-31 ENCOUNTER — Other Ambulatory Visit (HOSPITAL_COMMUNITY): Payer: Self-pay | Admitting: Pediatrics

## 2014-07-31 ENCOUNTER — Ambulatory Visit (HOSPITAL_COMMUNITY)
Admission: RE | Admit: 2014-07-31 | Discharge: 2014-07-31 | Disposition: A | Discharge status: Home / Self Care | Payer: Disability Insurance | Source: Ambulatory Visit | Attending: Pediatrics | Admitting: Pediatrics

## 2014-07-31 DIAGNOSIS — R0989 Other specified symptoms and signs involving the circulatory and respiratory systems: Secondary | ICD-10-CM | POA: Insufficient documentation

## 2014-07-31 DIAGNOSIS — J449 Chronic obstructive pulmonary disease, unspecified: Secondary | ICD-10-CM | POA: Diagnosis present

## 2014-07-31 DIAGNOSIS — R0600 Dyspnea, unspecified: Secondary | ICD-10-CM

## 2014-07-31 DIAGNOSIS — R0609 Other forms of dyspnea: Secondary | ICD-10-CM | POA: Insufficient documentation

## 2014-07-31 DIAGNOSIS — J438 Other emphysema: Secondary | ICD-10-CM | POA: Diagnosis not present

## 2015-03-06 IMAGING — CR DG CHEST 2V
2 series · 2 of 2 positions shown · non-contrast
Comparison: September 30, 2012

CLINICAL DATA: COPD and difficulty breathing

EXAM:
CHEST  2 VIEW

[view not recorded (1 of 2)]
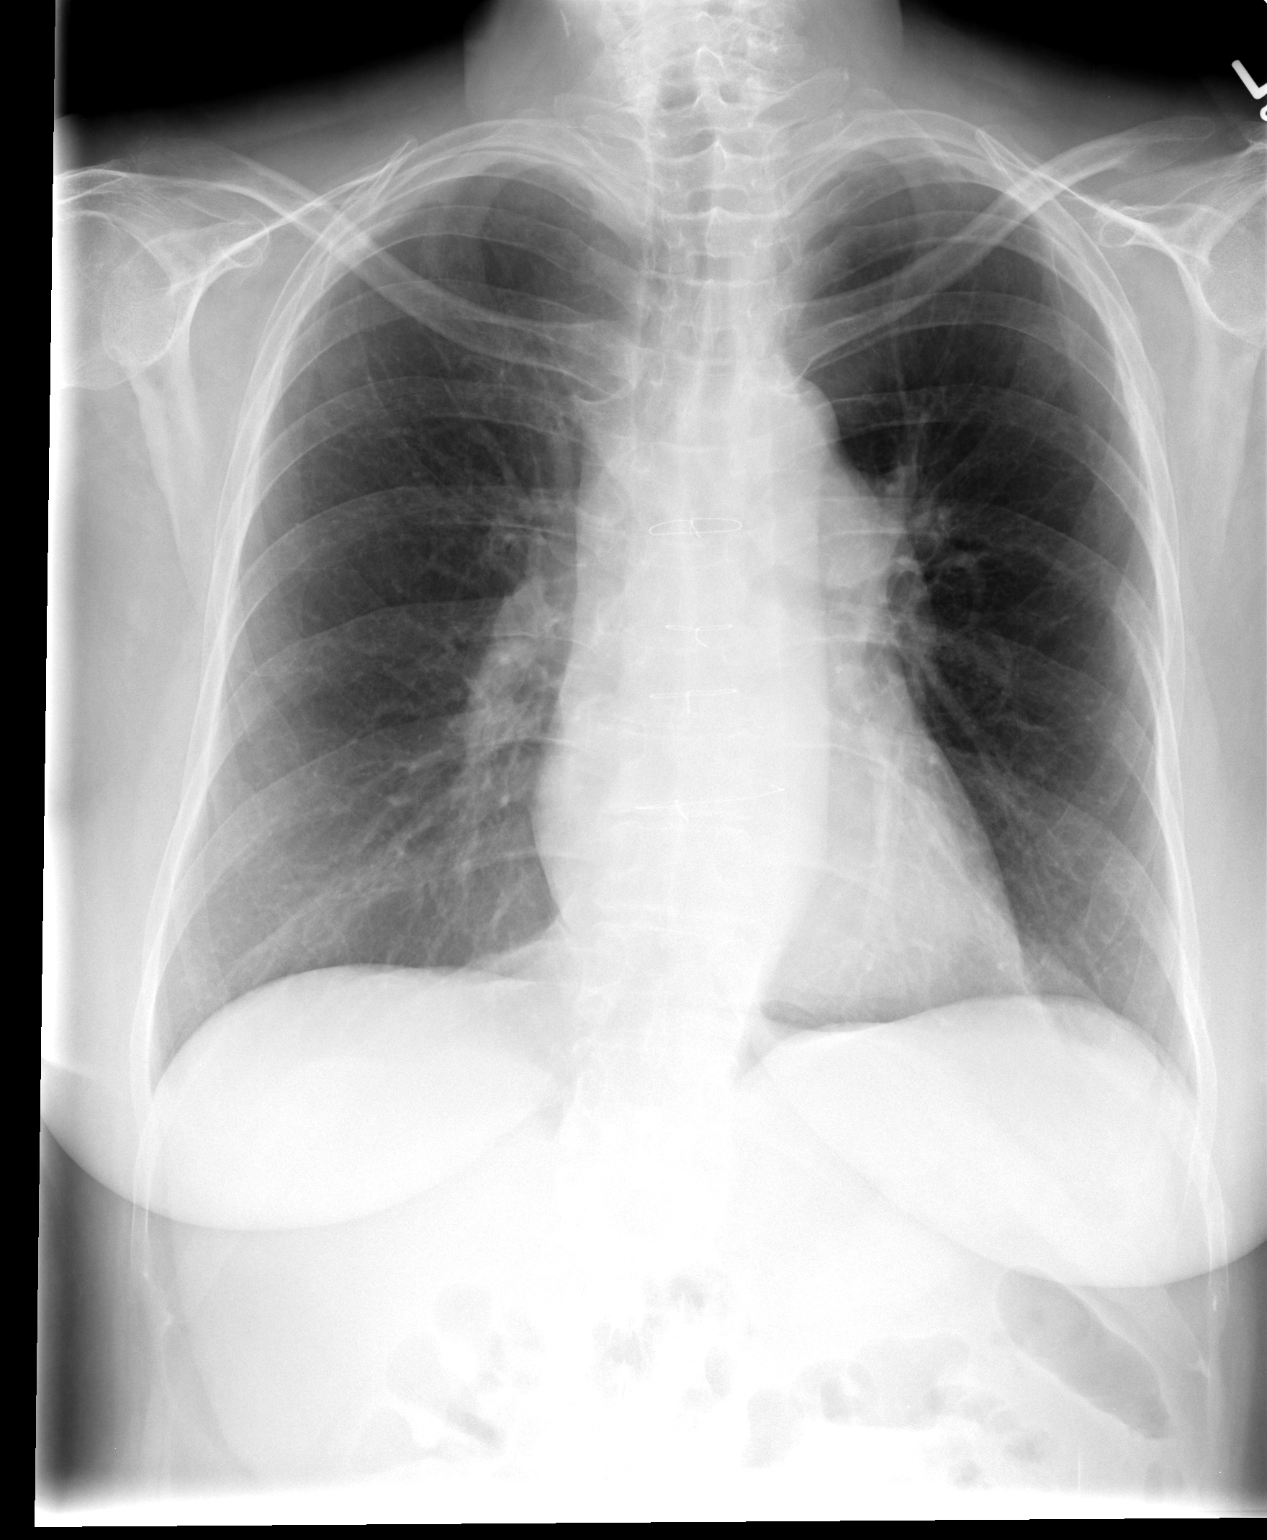

[view not recorded (2 of 2)]
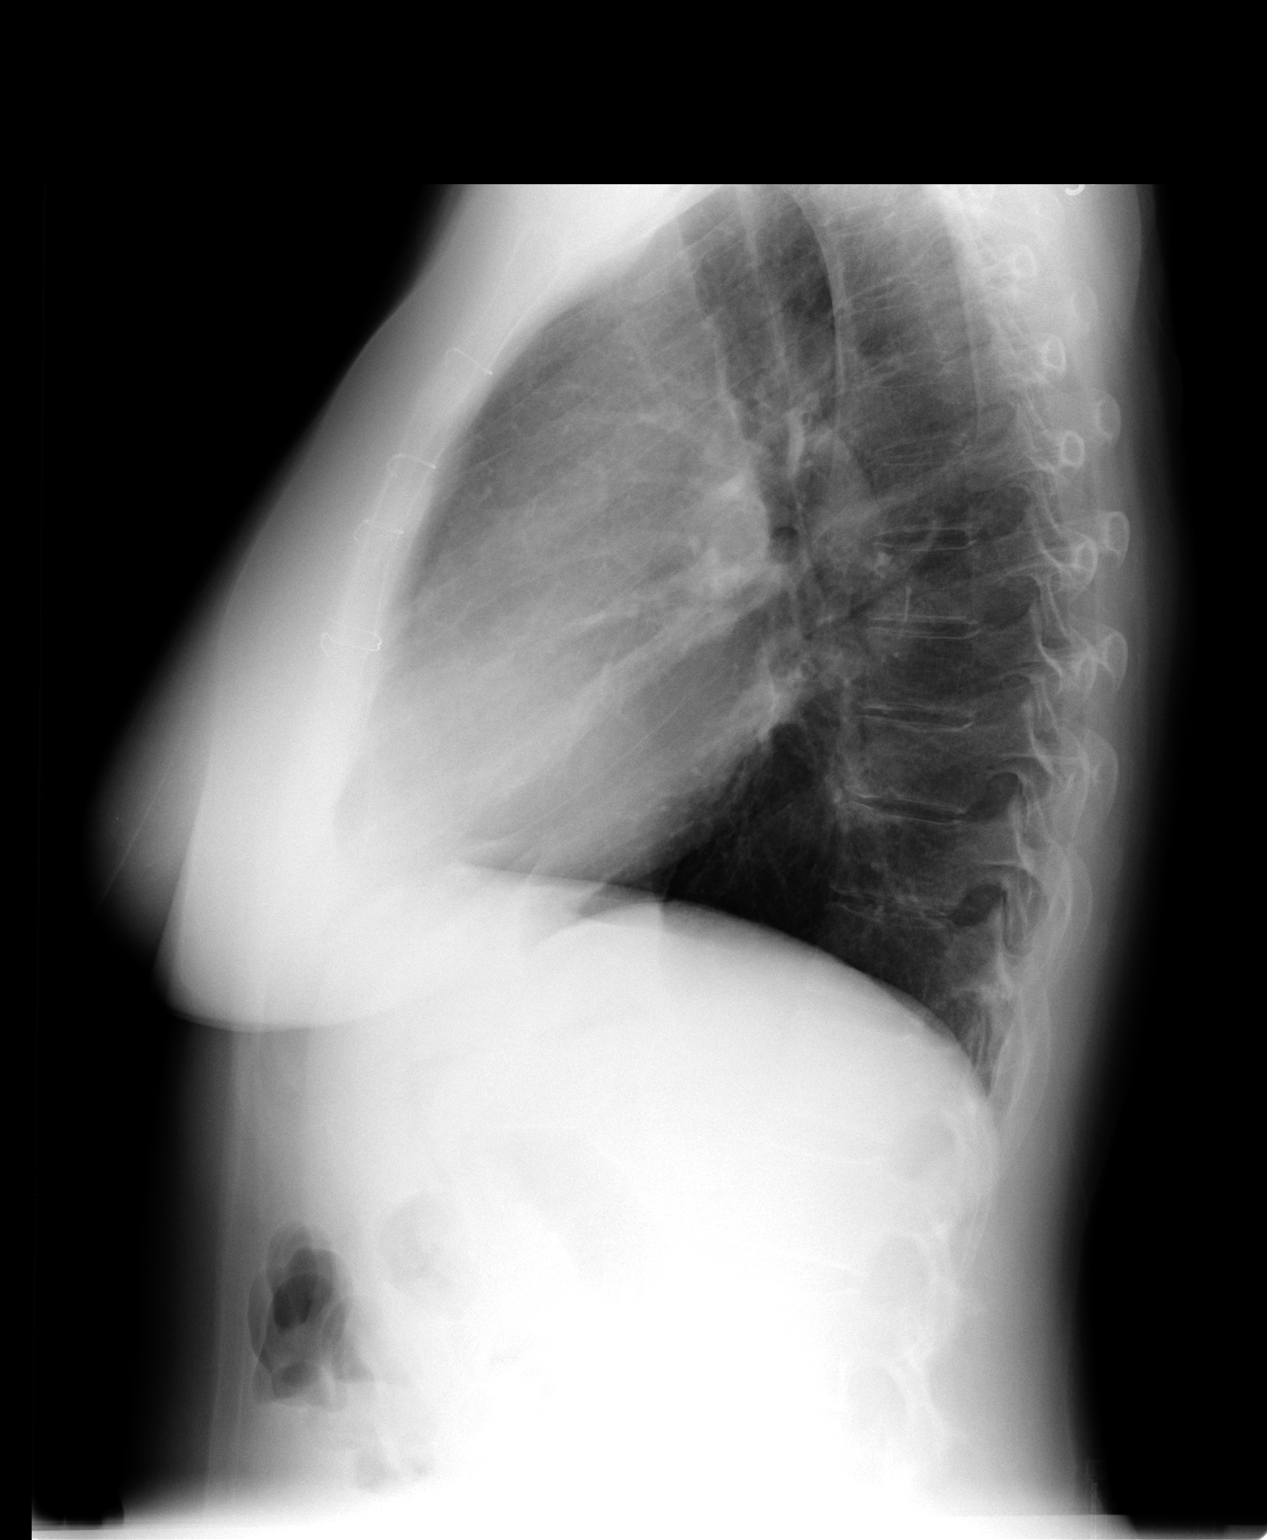

[2 of 2 positions shown; findings below may reference images not displayed]

FINDINGS: There is a degree of underlying emphysematous change. There is no
edema or consolidation. The heart size is within normal limits.
There is prominence of the main pulmonary arteries, particularly on
the left, with rapid peripheral tapering suggesting that there may
be pulmonary arterial hypertension present. No adenopathy is
appreciable. Patient is status post median sternotomy. No bone
lesions.
IMPRESSION: Emphysema with evidence suggesting superimposed pulmonary arterial
hypertension. No edema or consolidation.

## 2017-03-04 DEATH — deceased
